# Patient Record
Sex: Male | Born: 1965 | Race: White | Hispanic: No | Marital: Married | Smoking: Former smoker
Health system: Southern US, Community
[De-identification: ages and names within clinical notes are randomized; demographics above are authoritative.]

## PROBLEM LIST (undated history)

## (undated) DIAGNOSIS — F319 Bipolar disorder, unspecified: Secondary | ICD-10-CM

## (undated) DIAGNOSIS — R55 Syncope and collapse: Secondary | ICD-10-CM

## (undated) DIAGNOSIS — E119 Type 2 diabetes mellitus without complications: Secondary | ICD-10-CM

## (undated) DIAGNOSIS — R569 Unspecified convulsions: Secondary | ICD-10-CM

## (undated) DIAGNOSIS — I10 Essential (primary) hypertension: Secondary | ICD-10-CM

## (undated) HISTORY — DX: Unspecified convulsions: R56.9

## (undated) HISTORY — DX: Essential (primary) hypertension: I10

## (undated) HISTORY — PX: OTHER SURGICAL HISTORY: SHX169

## (undated) HISTORY — DX: Syncope and collapse: R55

## (undated) HISTORY — DX: Bipolar disorder, unspecified: F31.9

---

## 1998-03-10 ENCOUNTER — Emergency Department (HOSPITAL_COMMUNITY): Admission: EM | Admit: 1998-03-10 | Discharge: 1998-03-10 | Payer: Self-pay | Admitting: Emergency Medicine

## 2000-02-16 ENCOUNTER — Encounter: Payer: Self-pay | Admitting: Emergency Medicine

## 2000-02-16 ENCOUNTER — Emergency Department (HOSPITAL_COMMUNITY): Admission: EM | Admit: 2000-02-16 | Discharge: 2000-02-16 | Payer: Self-pay | Admitting: Emergency Medicine

## 2000-08-23 ENCOUNTER — Emergency Department (HOSPITAL_COMMUNITY): Admission: EM | Admit: 2000-08-23 | Discharge: 2000-08-23 | Payer: Self-pay

## 2000-08-23 ENCOUNTER — Encounter: Payer: Self-pay | Admitting: Emergency Medicine

## 2000-09-29 ENCOUNTER — Encounter: Payer: Self-pay | Admitting: Emergency Medicine

## 2000-09-30 ENCOUNTER — Encounter: Payer: Self-pay | Admitting: Emergency Medicine

## 2000-09-30 ENCOUNTER — Inpatient Hospital Stay (HOSPITAL_COMMUNITY): Admission: EM | Admit: 2000-09-30 | Discharge: 2000-10-05 | Payer: Self-pay | Admitting: Emergency Medicine

## 2000-10-01 ENCOUNTER — Encounter: Payer: Self-pay | Admitting: Internal Medicine

## 2000-11-23 ENCOUNTER — Emergency Department (HOSPITAL_COMMUNITY): Admission: EM | Admit: 2000-11-23 | Discharge: 2000-11-23 | Payer: Self-pay | Admitting: Emergency Medicine

## 2001-09-28 ENCOUNTER — Inpatient Hospital Stay (HOSPITAL_COMMUNITY): Admission: EM | Admit: 2001-09-28 | Discharge: 2001-10-16 | Payer: Self-pay | Admitting: *Deleted

## 2001-10-06 ENCOUNTER — Encounter: Payer: Self-pay | Admitting: Psychiatry

## 2001-10-06 ENCOUNTER — Ambulatory Visit (HOSPITAL_COMMUNITY): Admission: RE | Admit: 2001-10-06 | Discharge: 2001-10-06 | Payer: Self-pay | Admitting: Psychiatry

## 2001-12-06 ENCOUNTER — Inpatient Hospital Stay (HOSPITAL_COMMUNITY): Admission: EM | Admit: 2001-12-06 | Discharge: 2001-12-09 | Payer: Self-pay | Admitting: Psychiatry

## 2002-01-16 ENCOUNTER — Inpatient Hospital Stay (HOSPITAL_COMMUNITY): Admission: EM | Admit: 2002-01-16 | Discharge: 2002-01-18 | Payer: Self-pay | Admitting: Emergency Medicine

## 2002-01-16 ENCOUNTER — Encounter: Payer: Self-pay | Admitting: Emergency Medicine

## 2002-03-13 ENCOUNTER — Encounter: Payer: Self-pay | Admitting: Urology

## 2002-03-13 ENCOUNTER — Encounter: Admission: RE | Admit: 2002-03-13 | Discharge: 2002-03-13 | Payer: Self-pay | Admitting: Urology

## 2002-05-06 ENCOUNTER — Inpatient Hospital Stay (HOSPITAL_COMMUNITY): Admission: EM | Admit: 2002-05-06 | Discharge: 2002-05-07 | Payer: Self-pay | Admitting: Psychiatry

## 2002-09-17 ENCOUNTER — Inpatient Hospital Stay (HOSPITAL_COMMUNITY): Admission: EM | Admit: 2002-09-17 | Discharge: 2002-09-20 | Payer: Self-pay | Admitting: Psychiatry

## 2002-10-15 ENCOUNTER — Emergency Department (HOSPITAL_COMMUNITY): Admission: EM | Admit: 2002-10-15 | Discharge: 2002-10-15 | Payer: Self-pay | Admitting: Emergency Medicine

## 2003-02-08 ENCOUNTER — Encounter: Payer: Self-pay | Admitting: Emergency Medicine

## 2003-02-08 ENCOUNTER — Emergency Department (HOSPITAL_COMMUNITY): Admission: EM | Admit: 2003-02-08 | Discharge: 2003-02-08 | Payer: Self-pay

## 2003-06-16 ENCOUNTER — Encounter: Payer: Self-pay | Admitting: Emergency Medicine

## 2003-06-17 ENCOUNTER — Inpatient Hospital Stay (HOSPITAL_COMMUNITY): Admission: EM | Admit: 2003-06-17 | Discharge: 2003-06-18 | Payer: Self-pay | Admitting: Emergency Medicine

## 2003-06-17 ENCOUNTER — Encounter: Payer: Self-pay | Admitting: Neurology

## 2003-06-18 ENCOUNTER — Encounter: Payer: Self-pay | Admitting: Neurology

## 2003-09-18 ENCOUNTER — Inpatient Hospital Stay (HOSPITAL_COMMUNITY): Admission: EM | Admit: 2003-09-18 | Discharge: 2003-09-21 | Payer: Self-pay | Admitting: Emergency Medicine

## 2005-03-09 ENCOUNTER — Emergency Department (HOSPITAL_COMMUNITY): Admission: EM | Admit: 2005-03-09 | Discharge: 2005-03-09 | Payer: Self-pay | Admitting: Emergency Medicine

## 2006-05-21 ENCOUNTER — Ambulatory Visit: Payer: Self-pay | Admitting: Gastroenterology

## 2007-05-15 ENCOUNTER — Observation Stay (HOSPITAL_COMMUNITY): Admission: EM | Admit: 2007-05-15 | Discharge: 2007-05-15 | Payer: Self-pay | Admitting: Emergency Medicine

## 2007-05-15 ENCOUNTER — Ambulatory Visit: Payer: Self-pay | Admitting: Cardiology

## 2007-06-10 ENCOUNTER — Emergency Department (HOSPITAL_COMMUNITY): Admission: EM | Admit: 2007-06-10 | Discharge: 2007-06-10 | Payer: Self-pay | Admitting: Emergency Medicine

## 2007-07-11 ENCOUNTER — Emergency Department (HOSPITAL_COMMUNITY): Admission: EM | Admit: 2007-07-11 | Discharge: 2007-07-11 | Payer: Self-pay | Admitting: Emergency Medicine

## 2007-07-23 ENCOUNTER — Emergency Department (HOSPITAL_COMMUNITY): Admission: EM | Admit: 2007-07-23 | Discharge: 2007-07-23 | Payer: Self-pay | Admitting: Emergency Medicine

## 2007-07-30 ENCOUNTER — Emergency Department (HOSPITAL_COMMUNITY): Admission: EM | Admit: 2007-07-30 | Discharge: 2007-07-31 | Payer: Self-pay | Admitting: Emergency Medicine

## 2007-09-05 ENCOUNTER — Emergency Department (HOSPITAL_COMMUNITY): Admission: EM | Admit: 2007-09-05 | Discharge: 2007-09-05 | Payer: Self-pay | Admitting: Emergency Medicine

## 2008-03-15 ENCOUNTER — Emergency Department (HOSPITAL_COMMUNITY): Admission: EM | Admit: 2008-03-15 | Discharge: 2008-03-15 | Payer: Self-pay | Admitting: Emergency Medicine

## 2008-03-25 ENCOUNTER — Other Ambulatory Visit: Payer: Self-pay | Admitting: Emergency Medicine

## 2008-03-25 ENCOUNTER — Emergency Department (HOSPITAL_COMMUNITY): Admission: EM | Admit: 2008-03-25 | Discharge: 2008-03-25 | Payer: Self-pay | Admitting: Emergency Medicine

## 2008-03-26 ENCOUNTER — Observation Stay (HOSPITAL_COMMUNITY): Admission: EM | Admit: 2008-03-26 | Discharge: 2008-03-26 | Payer: Self-pay | Admitting: *Deleted

## 2008-03-27 ENCOUNTER — Ambulatory Visit: Payer: Self-pay | Admitting: *Deleted
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
## 2011-02-24 NOTE — Discharge Summary (Signed)
[name], [name]                         ACCOUNT NO.:  [id number]   MEDICAL RECORD NO.:  [id number]                   PATIENT TYPE:  IPS   LOCATION:  0301                                 FACILITY:  [facility]   PHYSICIAN:  Geoffery Lyons, MD                     DATE OF BIRTH:  August 01, 1966   DATE OF ADMISSION:  05/06/2002  DATE OF DISCHARGE:  05/07/2002                                 DISCHARGE SUMMARY   CHIEF COMPLAINT AND PRESENT ILLNESS:  This was one of several admissions to  [facility] for this 45 year old male, who presented to the  ACT team for evaluation.  Been unable to contract with his wife for two  hours on the phone, became panicky, somewhat desperate with a lot of worries  and concerns.  He was taking his medication but admits that he had changed  some medications in the past 1-2 days, maybe taking more of the  benzodiazepines as he has done in the past.  He denied any suicidal ideation  at this time.   PAST PSYCHIATRIC HISTORY:  Sees Dr. Evelene Croon on a regular basis.  This was one  of multiple admissions.   ALCOHOL/DRUG HISTORY:  Past history of mixed substance abuse.  Had abused  benzodiazepines before.   PAST MEDICAL HISTORY:  Distant history of seizures and history of arterial  hypertension.   MEDICATIONS:  Depakote 2000 mg at bedtime, Valium 10 mg three times a day,  lithium 450 mg twice a day.   PHYSICAL EXAMINATION:  Performed and failed to show any acute findings.   MENTAL STATUS EXAM:  Large-built male, bright, seeming alert, cooperative.  Good eye contact.  Anxious to leave the hospital.  Admitted with his 65-month-  old son.  Speech is normal.  Mood calm, euthymic.  Affect broad.  Thought  process with no suicidal or homicidal ideation.  Not wanting to be in the  hospital, had fear that he was admitted.  He was just wanting to have  someone to talk to.  Cognition well-preserved.   ADMISSION DIAGNOSES:   AXIS I:  1. Bipolar disorder.  2.  Anxiety disorder not otherwise specified.   AXIS II:  No diagnosis.   AXIS III:  1. History of seizures.  2. Hypertension.   AXIS IV:  Moderate.   AXIS V:  Global Assessment of Functioning upon admission 35; highest Global  Assessment of Functioning in the last year 60.   LABORATORY DATA:  CBC was within normal limits.  Blood chemistries were  within normal limits.  Thyroid profile was within normal limits.   HOSPITAL COURSE:  As patient was stating that he was not suicidal, that he  had an ongoing follow-up with his psychiatrist, that he was taking his  medication and his wife was agreeable with the plan, we went ahead and  discharged to outpatient follow-up.  DISCHARGE DIAGNOSES:   AXIS I:  1. Bipolar disorder.  2. Anxiety disorder not otherwise specified.   AXIS II:  No diagnosis.   AXIS III:  1. Seizures by history.  2. Hypertension.   AXIS IV:  Moderate.   AXIS V:  Global Assessment of Functioning upon discharge 55-60.   DISCHARGE MEDICATIONS:  Same medications as prescribed by Dr. Evelene Croon.   FOLLOW UP:  Dr. Evelene Croon as soon as possible.                                               Geoffery Lyons, MD    IL/MEDQ  D:  06/04/2002  T:  06/06/2002  Job:  [id number]

## 2011-02-24 NOTE — Consult Note (Signed)
[place]  Patient:    [name], [name]                      MRN: [id number] Proc. Date: 10/04/00 Adm. Date:  [id number] Attending:  Miguel Aschoff CC:         Rosanne Sack, M.D.  Robert A. Thurston Hole, M.D.   Consultation Report  DATE OF BIRTH:  1965-10-24.  CHIEF COMPLAINT:  Numbness in the right leg.  I was asked by Dr. Thurston Hole to see this 45 year old right-handed Caucasian male who has had a history of chronic alcohol abuse.  The patient fell down 30 steps at 1 p.m. on Saturday on September 28, 2000.  The patient did not have pain before he fell.  After the fall he had pain in his right lower quadrant and right lower extremity, especially his foot.  X-rays in the emergency department were negative but the patient continued to complain of severe pain and was admitted for further evaluation.  The patient has complained of numbness in the lateral anterior aspects of his right thigh and also right buttocks.  The numbness has extended down to the upper margins of his knee and actually has extended the lateral aspect of his right leg to his foot.  The patient has had exquisite tenderness in the lateral aspect of his left foot, where he had had a deep bruise.  He also has had traumatic muscle injury to his right abdominal wall with hemorrhage.  The patient developed rhabdomyolysis with CPKs of 45,264 and developed creatinine that peaked at 5.2, and dropped to 2.1 on the October 03, 2000. He fortunately did not need dialysis.  The patient has had persistent numbness in the right leg.  In order to further understand the extent of damage, neurology was asked to see this patient.  Other evaluations that have taken place have included MRI of the lumbar spine without contrast that shows a left L4-5 protrusion that may encroach on the left L4 nerve root, and mild facet disease at L5-S1, asymmetric to the right. There is no sign of  nerve root compression on the right side to explain the patients right-sided symptoms.  The patient has had some improvement of the numbness while at the same time continuing to complain of pain in the left abdomen which he says is keeping him awake, unless he takes "sleeping pills".  The patient has been detoxified with Librium and this is in the process of being tapered.  He is continuing to have some twitching which I suspect is related to alcohol withdrawal.  This will improve as detoxification is complete.  PAST MEDICAL HISTORY:  Negative.  REVIEW OF SYSTEMS:  Remarkable for palpitations of his heart especially when he is anxious and also asthma.  The patient has not had prior musculoskeletal injuries except to his left shoulder which was operated on.  (See below).  He has not had any rash, he has not had intercurrent infections.  He has not complained of headache.  He has not had any neurologic symptoms other than some weakness in the right leg and the hip flexors and numbness.  PAST SURGICAL HISTORY:  Left shoulder repair by Drs. Eulah Pont and Thurston Hole. Tonsillectomy as a child.  CUrRENT MEDICATIONS:  1. Colace 100 mg twice a day.  2. Protonix 40 mg per day.  3. Multivitamins 1 per day.  4. Lopressor 25 mg twice a day.  5. Phenergan 25 mg as  needed for nausea.  6. MS Contin SR twice a day.  7. Librium 10 mg per day (dose is being tapered).  8. Robaxin 500 mg every 6 hours as needed.  9. Laxative of choice. 10. Ativan  ALLERGIES:  PENICILLIN and CODEINE.  FAMILY HISTORY:  Strong history of family cancer.  Mother is alive and well. Father is disabled with cervical spondylosis.  He has 1 sister.  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  He drinks a 1/5th of hard liquor in a 2 day period but has stated that he wishes to go into long-term detoxification to deal with his alcoholism.  He smokes 1 pack of cigarettes per week.  He also uses marijuana  occasionally.  PHYSICAL EXAMINATION:  GENERAL:  The patient is a well-developed obese gentleman who was asleep when I came in to see him.  It took him a awhile to become aware of his surroundings.  VITAL SIGNS:  Blood pressure 147/69, resting pulse 82, respirations 20, temperature 97.1.  HEENT:  No signs of infections.  NECK:  Supple neck.  Full range of motion.  No cranial or cervical bruits.  LUNGS:  Clear to auscultation.  HEART:  No murmurs.  Pulse is normal.  ABDOMEN:  Soft, protuberant.  The patient has right abdominal wall tenderness. Bowel sounds are active.  No hepatosplenomegaly.  EXTREMITIES:  The right ankle is bruised.  NEUROLOGIC:  Awake, alert and oriented x 3.  Cranial nerves examination: Round reactive pupils.  Normal fundi.  Full visual fields to double simultaneous stimuli.  Extraocular movements full and conjugate.  Okay in responses and equal bilaterally.  Visual acuity not tested.  Symmetric facial strength.  Midline tongue and uvula.  Air conduction greater than bone conduction bilaterally.  MOTOR EXAMINATION:  The patient showed normal strength in all muscle groups other than the right hip flexors which were 4/5.  Sensation showed decreased sensation over the right buttock sparing the perianal region, the right lateral femorocutaneous ilioinguinal and mesial anterior region.  There is sparing of the posterior region below the buttocks.  The patient also is numb in the perineal nerve distribution and along the underside of her foot. There is no other numbness elsewhere.  Cortical sensation is intact.  CEREBELLAR EXAMINATION:  No tremor or dystaxia.  Symmetric gait and station was not tested.  Deep tendon reflexes are symmetric and brisk.  The patient had bilateral flexor plantar responses.  IMPRESSION: 1. The patient has a series of cutaneous nerve problems including lateral     femoral cutaneous, ilioinguinal, and the cutaneous nerves off the  sciatic    involving his buttocks.  He also has involvement of the perineal nerve but    only sensory because he does not show foot drop.  He does show some    involvement of the right femoral nerve because of hip flexor weakness.  I    do not see any other weakness or reflex change.     It appears to me that the patient has bruised several superficial nerves,    or stretched them.  I do not believe that this is a plexopathy.  It clearly    is not a radiculopathy.     Nerve conductions and EMGs are not useful in terms of showing denervation    for 14-21 days.  In this case, October 14, 2000 to October 21, 2000.     The patient has already shown some improvement and I expect this to  spontaneously improve.  If the patient begins to develop dysesthesias which    are currently present over the dorsum and lateral aspect of the right foot    neurontin at a dose of 300 mg 3 times a day fully escalating to 600-900 mg    3 times a day would be useful for dysesthesias.  The amount in his system    could be checked with gabapentin.  We can perform a EMG nerve conduction with some utility between October 14, 2000, and October 21, 2000, and thereafter, however, if the patient is improving it is probably not worth it.  I appreciate the opportunity to see Mr. Markie.  If you have questions about this or I can be of assistance please do not hesitate to contact me. DD:  10/05/00 TD:  10/05/00 Job: [id number]
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
## 2011-02-24 NOTE — H&P (Signed)
NAMEELZA, [name]                         ACCOUNT NO.:  [id number]   MEDICAL RECORD NO.:  [id number]                   PATIENT TYPE:  INP   LOCATION:  3001                                 FACILITY:  [facility]   PHYSICIAN:  Casimiro Needle L. Thad Ranger, M.D.           DATE OF BIRTH:  1966-05-26   DATE OF ADMISSION:  06/16/2003  DATE OF DISCHARGE:                                HISTORY & PHYSICAL   CHIEF COMPLAINT:  Headache.   HISTORY OF PRESENT ILLNESS:  This is the initial [facility]  admission for this 45 year old male with a past medical history which  includes bipolar disorder. The patient reports experiencing the worst  headache of his life with very sudden onset at about 6 p.m. in the evening  of June 16, 2003. The patient's father witnessed the event and says that  the patient suddenly complained of severe headache as if he had been hit up  against the head. He then slumped against a car and seemed to have  difficulty moving his left side and to slur his speech as if he could not  move the left part of his mouth and tongue. These focal symptoms lasted 25  to 30 minutes and then resolved, and there was no associated loss of  consciousness. The headache persists, and he describes it as a severe  piercing pain in the left frontotemporal area with some mild photophobia and  nausea as well as some stiffness of the neck. The patient has had headaches  in the past which he says he would take pain medication, either over-the-  counter medicines or hydrocodone preparation, and they would resolve. He has  never had a headache this severe. When the episode occurred, his father  alerted EMS who found him to have an elevated blood pressure, and he was  brought here for further consideration.   PAST MEDICAL HISTORY:  Past medical history is remarkable for bipolar  disorder for which he sees Dr. Pervis Hocking. He has no known previous history of  hypertension. He is obese.   FAMILY  HISTORY:  Remarkable for other members with bipolar disorder.   SOCIAL HISTORY:  He presently lives with father who is his power of  attorney. His father denies any alcohol or illicit drug use.   ALLERGIES:  CODEINE. He is also intolerant to LITHIUM.   MEDICATIONS:  1. Depakote ER 2,500 mg q.h.s.  2. Seroquel 100 mg q.a.m., 100 mg q.p.m., 600 mg q.h.s.  3. Valium 10 mg t.i.d.  4. Lorcet 10/650 p.r.n.  5. Geodon, just started, uncertain dose.   REVIEW OF SYSTEMS:  Remarkable for the headache as above. He has also had  diarrhea for the last couple of days as well as subjective chills and  occasional cough and shortness of breath but no chest pain. His father says  that he was started Geodon a few days ago, and since then, he has  been a  little bit more hyper and expansive. Ten-system review of systems is  otherwise negative except as stated above.   PHYSICAL EXAMINATION:  VITAL SIGNS:  Temperature 97.4, blood pressure  152/95, pulse 88, respirations 20.  GENERAL:  Obese but relatively healthy appearing man in some distress due to  pain, lying supine in the hospital bed.  HEART:  Cranial nerves funduscopic exam is benign. Pupils are equal and  reactive. Extraocular movements are full. Face, tongue, and palate move  normally and symmetrically.  NECK:  Supple without carotid bruit. There is some mild stiffness but no  Kernig's or Brudzinski's sign.  HEART:  Regular rate and rhythm without murmurs.  CHEST:  Clear to auscultation.  ABDOMEN:  Obese, nontender, normal active bowel sounds.  EXTREMITIES:  Motor exam:  Normal bulk and tone. Normal strength but there  is some give way due to motor impersistence on the left. Sensation:  He  reports diminished light touch sensation over the left hand and foot  compared to the right. Reflexes are symmetric. Toes are downgoing.   LABORATORY DATA:  CBC is unremarkable. BMET is remarkable only for a mildly  elevated ALT of 44. Coags are normal.  Urine drug screen is positive for  opiates and benzodiazepines. Depakote level is less than 10. CT of the head  is personally reviewed, and I agree that it is normal. Spinal fluid  examination reveals 33 red blood cells in tube #2, 8 red blood cells in tube  #4 with normal white cells, normal glucose, and normal protein.   IMPRESSION:  Headache with focal features. Etiology is uncertain. He does  have a small amount of blood in his CSF, and while this is likely due to  traumatic tap, subarachnoid hemorrhage cannot be entirely excluded.   PLAN:  Will admit for observation. Proceed tomorrow with angiogram as well  as MRI of the brain. He will also get pain control, and we will try  intravenous Dilaudid for this purpose.                                                Michael L. Thad Ranger, M.D.    MLR/MEDQ  D:  06/17/2003  T:  06/17/2003  Job:  [id number]

## 2011-02-24 NOTE — Discharge Summary (Signed)
[name]  Patient:    [name], [name] Visit Number: [id number] MRN: [id number]          Service Type: MED Location: 2000 2008 01 Attending Physician:  Jetty Duhamel T Dictated by:   Jetty Duhamel, M.D. Admit Date:  01/16/2002 Disc. Date: 01/18/02                             Discharge Summary  DATE OF BIRTH:  May 30, 1966  PRIMARY CARE PHYSICIAN:  Unassigned.  OUTPATIENT PSYCHIATRIST:  Youlanda Mighty, M.D.  DISCHARGE DIAGNOSES: 1. Toxic ingestion of multiple substances - benzodiazepines, opiates,    tricyclics. 2. Suicide attempt. 3. History of bipolar disorder. 4. Hypertension. 5. Asthma. 6. History of alcohol abuse. 7. Hypokalemia - resolved. 8. Abdominal pain of unknown etiology - work-up negative.  DISCHARGE MEDICATIONS: 1. Toprol XL 100 mg q.d. 2. Albuterol inhaler 2 puffs q.i.d. p.r.n. wheezing. 3. Further psychotropic medications to be determined during inpatient    hospitalization at [facility]. 4. Pepcid 20 mg p.o. b.i.d.  CONSULTATIONS:  Dr. Rico Junker with inpatient psychiatric services for evaluation and psychiatric disposition.  PROCEDURES: 1. CT scan of the head without contrast medium on January 16, 2002, no acute    abnormality. 2. Complete abdominal ultrasound on January 17, 2002, wholly unremarkable. 3. KUB on January 18, 2002, normal.  FOLLOW-UP:  The patient is advised to follow up with his routine medical doctor within 10-14 days after his discharge from [facility] for ongoing medical attention.  HISTORY OF PRESENT ILLNESS:  Mr. [name] is a gentleman with a medical history as noted above, who presented to the hospital on date of admission after having consumed a very large but unknown quantity of primarily Valium with an estimation of approximately 97 pills ingested at once.  He was also noted on drug screen to be positive for tricyclics as well as opiates, but  the exact source of these could not be identified.  He was admitted to the hospital by the [facility] for medical clearance and psychiatric consultation for ultimate psychiatric disposition.  HOSPITAL COURSE: #1 - TOXIC INGESTION/SUICIDE ATTEMPT:  The patient was admitted to a monitored bed with 24-hour sitter for suicide precautions.  Given his benzodiazepine ingestion, he was monitored initially with continuous pulse oximetry and cardiac telemetry.  There was no significant respiratory depression, although the patient was markedly somnolent during initial portion of hospitalization. O2 saturation was well-maintained with only minimal supplemental oxygen initially and later parts of hospitalization, no oxygen whatsoever was required.  There was no evidence of aspiration during the hospitalization. Tricyclics were noted on urine drug screen, but there were no EKG abnormalities or metabolic derangements to suggest a significant ingestion of tricyclics.  The exact source of the tricyclics could not be identified.  No significant abnormalities were appreciated on telemetry monitoring.  From the standpoint of his toxic ingestion/suicide attempt, the patient was medically cleared on January 18, 2002, for ultimate psychiatric disposition.  #2 - BIPOLAR DISORDER:  The patient has a long history of bipolar disorder and episodes of violent/aggressive behavior which is difficult to control. Because of his complicated psychiatric history and his suicide attempt, Dr. Jeanie Sewer with inpatient psychiatric services was consulted for evaluation.  Dr. Jeanie Sewer left a complete consultation note on January 17, 2002, in which he stated that the patient was felt to be a risk for suicide and a  danger to himself.  Discussion was held with the patients father, who is his power of attorney, who apparently agreed to see that the patient was committed to [facility] for inpatient  psychiatric therapy.  Dr. Jeanie Sewer did recommend that should the patients father renege on his decision to have the patient committed, that he would proceed with involuntary commitment because the patient was deemed to be a danger to himself.  Psychotropic medications were held, and ultimate regimen will be determined with intensive psychiatric follow up via [facility] upon transfer from [facility]  #3 - ABDOMINAL PAIN:  On the second of hospitalization, when the patient awoke, he began to complain of vague abdominal pain.  Initially he stated that it was sharp in nature and mostly in the right upper quadrant but with repeat questioning, he stated that it was generalized and more crampy.  Despite the patients abdominal pain, he continued to have a voracious appetite and repeatedly demanded food.  Because of the patients abdominal pain, however, he was kept n.p.o. initially until a full evaluation could be obtained. Ultrasound of the abdomen was obtained without any significant findings to include a normal gallbladder and no evidence of hepatic parenchymal damage. Further evaluation was obtained with a KUB to assure that there was no evidence of ileus, and the KUB itself was in fact normal as well.  The patients pain initially was treated with morphine sulfate.  The patient reported some resolution of pain with this.  Of note, the pancreas was noted to be unremarkable on ultrasound as well.  There was no evidence of splenomegaly, ascites, or abdominal aortic aneurysm.  There was no evidence of renal mass or hydronephrosis.  With a completely normal ultrasound, completely normal KUB, and comprehensive metabolic panel including wholly unremarkable liver function tests, it was felt that the patient no longer required inpatient evaluation for his abdominal pain.  Potential etiologies for the patients ongoing pain include simple pulled muscle within the abdominal wall versus a  simple gastritis.  At time of medical clearance, the patient was able to tolerate a full diet without any difficulty whatsoever.  On physical exam,  there was absolutely no distention of the abdomen with positive bowel sounds and no hepatosplenomegaly, and the abdomen was soft.  In short, there were no significant findings on physical exam to suggest significant abdominal pathology.  It was recommended that over-the-counter analgesics such as ibuprofen or even preferably Tylenol be used to control the patients pain and consideration could be given to use of a proton pump inhibitor such as Protonix or simply Pepcid at 20 mg b.i.d. for one week.  #4 - HYPERTENSION:  At time of admission, the patient was known to have a history of hypertension and had been treated with Toprol XL in the outpatient setting.  This was initially held because of the patients overall sedated state.  When the patient became more awake, however, vital signs did begin to trend upward with blood pressure in the 150 range.  As a result, the patient is being restarted on his Toprol at time of his discharge.  #5 - HISTORY OF ASTHMA:  During hospitalization, the patient never exhibited any wheezing.  Asthma was under complete control.  At discharge, he is being advised to continue using albuterol on an as-needed basis via metered dose inhaler.  DISPOSITION:  The patient is now medically cleared for discharge from [facility].  As per results of the psychiatric consultation provided by Dr.  Williford, he will be transferred either voluntarily or involuntarily to [facility] for ongoing inpatient treatment for suicidal ideation. Dictated by:   Jetty Duhamel, M.D. Attending Physician:  Jetty Duhamel T DD:  01/18/02 TD:  01/18/02 Job: [id number]

## 2011-02-24 NOTE — Discharge Summary (Signed)
[name], [name]                         ACCOUNT NO.:  [id number]   MEDICAL RECORD NO.:  [id number]                   PATIENT TYPE:  INP   LOCATION:  3001                                 FACILITY:  [facility]   PHYSICIAN:  Genene Churn. Love, M.D.                 DATE OF BIRTH:  18-Nov-1965   DATE OF ADMISSION:  06/16/2003  DATE OF DISCHARGE:  06/18/2003                                 DISCHARGE SUMMARY   PATIENT ADDRESS:  [address], [place], [place]  [place] [address]   REASON FOR ADMISSION:  This is the first [facility] admission for  this 45 year old right-handed white male with a past history of bipolar  disorder and migraine headaches who experienced the worst headache of his  life at 6 p.m. on the evening of June 16, 2003 and was seen in the  emergency room.   HISTORY OF PRESENT ILLNESS:  This patient's father witnessed the event and  says that the patient suddenly complained of severe headache as if he had  been hit in the head with a baseball bat and then slumped to the floor with  complaints of difficulty moving his left side and slurred speech.  The focal  symptoms lasted 25-30 minutes and then resolved and was not associated with  definite loss of consciousness or seizure.  The headache persisted and is  described as a severe piercing pain in the left frontotemporal region and  subsequently in the hospital in the right frontotemporal region.  There is  mild photophobia and nausea with some stiffness of the neck.  The patient  has a past history of migraine headaches for which he has taken Stadol  prescribed by Dr. Kellie Shropshire.  He had never had a headache this severe and  the patient was seen by EMS who found elevated blood pressure and he was  brought to the [facility] for further evaluation.   PAST MEDICAL HISTORY:  His past medical history was significant for bipolar  disorder.   FAMILY HISTORY:  He had a  positive family history for bipolar disorder.   SOCIAL HISTORY:  He lives with his father who is his Power of [place].  He  denies any history of alcohol or drug abuse.   ALLERGIES:  He is intolerant to LITHIUM and may have an allergy to CODEINE.   MEDICATIONS AT TIME OF ADMISSION:  1. Depakote ER 2500 mg at bedtime.  2. Seroquel 100 mg q.a.m. and 100 mg q.p.m. and 600 mg at bedtime.  3. Valium 10 mg three times daily.  4. Lorcet 10/650 p.r.n.  5. Geodon which was recently started and discontinued by the patient.   REVIEW OF SYSTEMS:  Review of systems was remarkable for headache, there had  also been some diarrhea over the last few days, he had started on Geodon but  this had been  discontinued.   EXAMINATION AT TIME OF ADMISSION:  VITAL SIGNS:  Temperature of 97.4, blood  pressure of 152/95, heart rate was 88, respiratory rate was 20.  GENERAL:  He was an obese male who appeared distressed with pain lying  supine.  HEAD AND NECK:  There were no bruits, there was some mild stiffness to his  neck.  The tympanic membranes were clear.  LUNGS:  Clear to auscultation.  HEART:  Regular rate and rhythm without murmurs.  ABDOMEN:  Normoactive bowel sounds.  EXTREMITIES:  Had normal strength in the upper and lower extremities, deep  tendon reflexes were 2+,  plantar responses were downgoing.   LABORATORY DATA:  Laboratory data revealed a CBC which is unremarkable, BMET  was remarkable for slightly elevated ALT of 44, coagulopathies were normal,  urine drug screen was positive for opiates and benzodiazepines, valproic  acid level was less than 10, CT scan of the head was reviewed and was  normal, spinal fluid examination revealed 33 red blood cells in the second  tube, 8 red blood cells in tube #4 with normal white blood cell count,  normal glucose, and normal protein.   The patient underwent a cerebral arteriogram on June 18, 2003 which  showed no evidence of any angiographic  abnormalities.  There was no evidence  of any dissection and no evidence of any spasm was noted.  During his  hospital course the patient underwent  treatment with Dilaudid therapy for  his pain with fair relief.  He had an MRI of the brain ordered, the results  of which I do not have at this time.  The cerebral arteriogram showed no  significant abnormalities.   IMPRESSION:  1. Severe headache - code 784.0.  2. History of migraine headaches - code 346.10.  3. Bipolar disease - code 296.7.   PLAN:  Discharge the patient to his father's care on his home medications  which would include Stadol nasal spray at the patient's request one spray in  one nostril for headache - no relief another two hours later - no more than  two sprays in 24 hours, Seroquel 100 mg b.i.d. and 600 mg at bedtime,  Depakote ER normally given at 2500 mg at bedtime but he takes it 1000 mg in  the morning, 1000 mg at lunch and 500 mg at bedtime, and Valium 10 mg three  times daily.  He is not to return to work until he sees Dr. Thad Ranger in 1  week.  He is not to drive a car.  He is discharged improved from his  prehospital status.                                                Genene Churn. Sandria Manly, M.D.    JML/MEDQ  D:  06/18/2003  T:  06/19/2003  Job:  [id number]   cc:   Milagros Evener, M.D.  [address] [address]  [place], [place] [address]  Fax: [phone]   Merlene Laughter. Renae Gloss, M.D.  [address]  [address]  [place]  [place] [address]  Fax: [phone]

## 2011-02-24 NOTE — H&P (Signed)
[name], [name]                         ACCOUNT NO.:  [id number]   MEDICAL RECORD NO.:  [id number]                   PATIENT TYPE:  IPS   LOCATION:  0301                                 FACILITY:  [facility]   PHYSICIAN:  Geoffery Lyons, M.D.                   DATE OF BIRTH:  Jan 05, 1966   DATE OF ADMISSION:  05/06/2002  DATE OF DISCHARGE:  05/07/2002                         PSYCHIATRIC ADMISSION ASSESSMENT   IDENTIFYING INFORMATION:  This is a 45 year old, Caucasian male. He is a  voluntary admission. He is married.   HISTORY OF PRESENT ILLNESS:  This patient presented to the ________  yesterday for evaluation after being unable to contact his wife by phone for  about 2 hours, he became a bit panicky and a somewhat agitated and he felt a  bit desperate and had some vague suicidal thoughts related to the fact that  he was becoming a little bit depressed and he also admitted that he had not  taken his medications regularly in 1-2 days. He became less panicky after he  was able to contact his wife and she just reported she had simply turned the  phone down so that the baby would go to sleep. The patient was calm and  cooperative today, well-focused, denies any suicidal ideation. He does admit  that he has had a few additional worries lately because his wife is going  through some postpartum depression and he has had concerns about that. He  categorically guarantees safety and denies any suicidal ideation.   PAST PSYCHIATRIC HISTORY:  This is one of multiple admissions for this man  with a history of bipolar disorder, a distant history of seizure disorder  and some past history of substance abuse. The patient is followed by Dr.  Dossie Arbour.   SOCIAL HISTORY:  This is a 45 year old male who lives in a stable marriage  with his wife and 61-month-old son. He currently is on disability for a mood  disorder.   FAMILY HISTORY:  Unclear.   ALCOHOL AND DRUG HISTORY:  He denies any current  substance abuse.   MEDICAL PROBLEMS:  Patient does see his primary care physician regularly for  history of hypertension. He also has a distant history of seizures which is  unclear.   MEDICATIONS:  Depakote 200 mg p.o. q.h.s., Valium 10 mg p.o. t.i.d. and  lithium 450 mg p.o. b.i.d.   DRUG ALLERGIES:  PENICILLIN.   POSITIVE PHYSICAL FINDINGS:  Patient's vital signs on admission to the unit:  Temp 98.1, pulse 60, respirations 22, blood pressure 144/89. He is  approximately 6 feet tall and weighs 223 pounds. This is a well-nourished,  well-developed male with a calm affect, hygiene satisfactory.  HEAD: Normocephalic, atraumatic. Grooming is good. Some male pattern  baldness.  EENT: PERRLA. Sclerae and lenses appear clear. Fundi not visualized. Hearing  intact to normal voice. No rhinorrhea. Oropharynx is noninjected. Teeth in  satisfactory condition.  NECK: Supple. No thyromegaly. Full range of motion.  CARDIOVASCULAR: S1 and S2 heard. No clicks, murmurs or gallops.  LUNGS: Clear to auscultation.  CHEST: Symmetrical with good excursion. Breathing easy.  ABDOMEN: Flat, soft and quiet. Nontender.  GENITALIA: Deferred.  MUSCULOSKELETAL: Spine is straight. Posture upright. Strength is 5/5. No  tenderness or erythema of joints.  NEUROLOGIC: Cranial nerves II-XII intact. EOM's intact without nystagmus.  2+/5 DTR's, symmetrical. Romberg without findings. Motor movements smooth.  Sensory grossly intact.   MENTAL STATUS EXAM:  This is a large-built male who is calm and composed. He  is in control with a bright, calm affect. He is fully alert in no acute  distress. His eye contact is good. He is well-focused. He is quite bright  and anxious to share photos of his 55-month-old son. His speech is normal.  His mood is calm and euthymic. He feels a bit embarrassed about his level of  panic yesterday when he was unable to contact his wife. His thought is  logical, coherent, goal-directed. No  evidence of suicidal ideation or  homicidal ideation. No thought deficit. He categorically promises safety.  Cognitively, he is intact and oriented x 3.   IMPRESSION:  AXIS I:  1.  Adjustment Reaction Not Otherwise Specified.  1. Bipolar Disorder, currently stable.  AXIS II:  Deferred.  AXIS III:  1.  Distant history of seizure disorder.  1. History of hypertension.  AXIS IV:  Deferred.  AXIS V:  Current: 58, Past year: 54   PLAN:  To voluntarily admit the patient. We have for the past 24 hours. We  will discharge him today and refer him to [facility] for psychotherapy  and have him resume his current medications. He will resume appointments  with Dr. Lafayette Dragon for medication management. This is both discharge and  admission transcription.    ____________________________________                                            Viviann Spare, N.P.    MAS/MEDQ  D:  05/07/2002  T:  05/09/2002  Job:  [id number]

## 2011-02-24 NOTE — Consult Note (Signed)
[place]  Patient:    [name], [name]                      MRN: [id number] Adm. Date:  [id number] Attending:  Rosanne Sack CC:         Rosanne Sack, M.D.   Consultation Report  REASON FOR CONSULTATION:  Elevated creatinine.  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  He was found to have a swollen right abdominal oblique muscles on CT scan, but did not sustain any bone fractures.  He was found to have marked rhabdomyolysis with a CPK of 45,000 and renal insufficiency with a creatinine of 3.5 on admission this morning.  He has a history of alcohol abuse with heavy drinking after work at least a few times a week.  He denies any history of renal disease or over-the-counter NSAID use.  His only complaint currently is right abdominal wall pain.  No shortness of breath, nausea, vomiting, or chest pain.  PAST MEDICAL HISTORY:  None.  PAST SURGICAL HISTORY:  None offered.  MEDICATIONS:  None.  INPATIENT MEDICATIONS:  Morphine PCA, Librium, ______, and Colace.  ALLERGIES:  PENICILLIN, ______.  SOCIAL HISTORY:  The patient is separated and has a 59 year old daughter who lives with the patients wife in [place].  He works in [employer]. Alcohol use is heavy as above.  Tobacco about one pack per day.  Occasional marijuana.  REVIEW OF SYSTEMS:  GENERAL:  No fevers, chills, or weight loss.  ENT:  No hearing loss, visual changes, sore throats, difficulty swallowing. RESPIRATORY:  No productive cough, hemoptysis, or pleuritic chest pain. CARDIAC:  No history of MI, angina, or substernal chest pain currently. PHYSICAL EXAMINATION:  GENERAL:  This is an overweight white male sedated, but alert and oriented ______ and appropriate.  VITAL SIGNS:  Blood pressure 160/90, temperature 99, pulse 90, respirations 20.  SKIN:  No  rash or ______ reticularis pattern.  HEENT:  Negative, particularly throat is moist.  NECK:  Supple without JVD or meningismus.  CHEST:  Clear bilaterally.  CARDIAC:  Regular rate and rhythm without murmur, rub, gallop.  ABDOMEN:  Soft, obese with right-sided abdominal wall tenderness.  Bowel sounds are active.  No hepatosplenomegaly.  EXTREMITIES:  No significant edema.  NEUROLOGIC:  Alert, oriented x 3.  Nonfocal motor examination.  LABORATORIES:  Sodium 136, potassium 4.5, CO2 20, BUN 42, creatinine 5.1. Those laboratories are from 10:00 today.  Previous creatinine earlier this morning was 3.5, now increased to 5.1.  CPK has gone from 45 to 36,000 today. Urinalysis shows large blood and heavy protein.  CBG was not done.  SGOT 285, SGPT 152.  Chest x-ray I do not believe was done either.  IMPRESSION: 1. Acute renal failure due to rhabdomyolysis/ATN complicating a fall with    abdominal wall muscle injury.  Also, history of heavy alcohol use which    possibly could predispose him to muscle injury. 2. Status post fall yesterday. 3. Alcohol history. 4. Tobacco use. 5. Volume:  ______ now.  RECOMMENDATIONS:  Agree with current management including bicarbonate containing fluids to try to alkalinize the urine.  Will have a Foley placed and follows strict Is and Os.  With the rapid rising creatinine he may progress to dialysis within the next few days.  This was explained to the patient in detail.  Plan for renal ultrasound  in the morning.  Lasix 60 IV q.12. DD:  09/30/00 TD:  09/30/00 Job: [id number]
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
## 2011-02-24 NOTE — Discharge Summary (Signed)
[place]  Patient:    [name], [name]                      MRN: [id number] Adm. Date:  [id number] Disc. Date: [id number] Attending:  Miguel Aschoff CC:         Aura Dials, M.D., [facility]  Robert A. Thurston Hole, M.D.  Richard F. Caryn Section, M.D.  Deanna Artis. Sharene Skeans, M.D.   Discharge Summary  DATE OF BIRTH:  [birth year]  CONSULTATIONS: 1. Robert A. Thurston Hole, M.D. 2. Wilber Bihari. Caryn Section, M.D. 3. Deanna Artis. Sharene Skeans, M.D.  PROCEDURES:  None.  DISCHARGE DIAGNOSES:  1. Fall resulting in soft tissue injury.  2. Rhabdomyolysis secondary to muscle injury, alcoholism (peak creatine     phosphokinase 45,000, 1187 at discharge).  3. Acute renal failure secondary to #2 (admission creatinine 3.5, peak 5.2,     1.1 at discharge).  4. Right oblique muscle hematoma.  5. Right ankle sprain.  6. Superficial nerve injury right hip and right lower extremity without     evidence of radiculopathy.  7. Alcoholism with inebriated presentation and withdrawal delirium.  8. Chronic bronchospasm (chronic obstructive pulmonary disease versus     asthma; no pulmonary function tests).  9. Tobacco abuse. 10. Nonspecific hepatitis, resolving, likely secondary to alcoholism and     rhabdomyolysis (peak serum glutamic oxaloacetic transaminase 285,     45 at discharge, peak serum glutamate pyruvate transaminase 152,     75 at discharge).  DISCHARGE MEDICATIONS:  1. Albuterol inhaler two puffs q.4h. p.r.n. wheezing.  2. Ibuprofen 600 mg q.6h. p.r.n.  3. Tylox one to two q.4-6h. p.r.n. pain #30 with no refill.  4. Colace 100 mg p.o. b.i.d. p.r.n.  DISCHARGE FOLLOWUP:  1. Dr. Tracey Harries, who is not familiar with the patient, has agreed to see     Mr. Jolliff in followup for recheck of creatinine, liver enzymes, and     clinical symptoms until Mr. Therien can establish with [facility].  2. He is to follow up with Dr. Salvatore Marvel  on January 3 or January 7.  HISTORY OF PRESENT ILLNESS:  Patient is a 45 year old single man who lives independently, admitted to the hospital after falling down 30 steps in an inebriated state.  He does not recall the fall, but awakened with significant pain in the right lower abdomen and right lower extremity, finding it difficult to breathe secondary to lower right chest wall pain.  For details of presentation, please see handwritten H&P by Dr. Marinda Elk, admitting physician on unassigned call.  HOSPITAL COURSE:  #1 - FALL RESULTING IN SOFT TISSUE INJURY:  [name] underwent plain films of the chest and abdomen as well as abdominal CT, and ultimately MRI of the spine.  Findings included a hematoma of the right internal and external oblique muscles.  Skeletal structures of the chest and pelvis were not injured.  MRI revealed no evidence of acute fracture or paraspinal hematoma, but commented on degenerative disk disease at L4-5 and L5-S1 associated with small left foraminal disk protrusion at L4-5 on the left, the opposite side of his injury.  The pain was treated with morphine pump to prevent atelectasis from respiratory splinting.  With time, he was transitioned to oral morphine and was better able to move about.  Significant neurologic findings included numbness over the right lower quadrant, right anterolateral thigh, and right buttock, consistent with superficial injury of  the right lateral femoral cutaneous, right peroneal nerve, right femoral nerve (motor), right ilioinguinal and superficial nerves off the serrati.  The only motor neuron involvement was of hip flexors, 4/5 on the right with no visible impairment during gait.  Dr. Ellison Carwin consulted day prior to discharge and felt that [name] would continue to spontaneously improve.  If he were to develop dysesthesias in the healing process, Neurontin beginning at 300 t.i.d. may be slowly titrated up to 600  to 900 t.i.d. while following gabapentin levels.  If he fails to improve, EMG and nerve conduction studies could be performed, no earlier than October 14, 2000.  Dr. Gerald Leitz group will be available for an outpatient referral should this be required for poor improvement.  At discharge, Mr. Hanshaw had significantly improved.  He has obvious edema and erythematous blush over the right oblique muscles, with improving tenderness.  He is breathing deeply with no further splinting.  He is ambulatory.  #2 - RHABDOMYOLYSIS SECONDARY TO ACUTE RENAL FAILURE:  [name] was noted to have elevated CPK of levels consistent with rhabdomyolysis.  Aggressive hydration with bicarbonated fluids was initiated, and renal function as well as elevated CPK resolved rapidly.  Discharge values noted on above problem list.  Because of autodiuresis, he has required aggressive potassium replacement, although now that creatinine has normalized this should not be a further problem.  Would advise followup of potassium at the same time creatinine is evaluated in his followup visit.  #3 - CHRONIC ALCOHOLISM:  [name] went through withdrawal manifested by delirium, tachycardia, hypertension, and tremor.  He was treated with thiamine and Librium, and is stable having completed withdrawal symptoms at the time of discharge.  He is very interested in rehab, and feels that an inpatient environment would be best for him.  Case manager social workers visited Mr. Bona in the hospital and provided literature for available community programs.  Enrollment will require his own initiative.  #4 - NONSPECIFIC HEPATITIS/RESOLVING:  [name] has at least two causative insults, alcohol and rhabdomyolysis.  His enzymes have improved dramatically and should continue to normalize.   #5 - RIGHT ANKLE SPRAIN:  [name] was evaluated by orthopedics who recommended a cane walker.  There did not appear to be fracture but he  does have significant swelling and ecchymosis primarily of the dorsal and lateral aspects of the foot.  He should continue elevation and ice application, and follow up as recommended with Dr. Salvatore Marvel in 7-10 days.  DD:  10/05/00 TD:  10/05/00 Job: [id number]

## 2011-02-24 NOTE — Discharge Summary (Signed)
   NAMERAVIS, HERNE                         ACCOUNT NO.:  [id number]   MEDICAL RECORD NO.:  [id number]                   PATIENT TYPE:  IPS   LOCATION:  0502                                 FACILITY:  [facility]   PHYSICIAN:  Jeanice Lim, M.D.              DATE OF BIRTH:  [birth year]   DATE OF ADMISSION:  09/17/2002  DATE OF DISCHARGE:  09/20/2002                                 DISCHARGE SUMMARY   No dictation for this job.                                               Jeanice Lim, M.D.    JEM/MEDQ  D:  09/29/2002  T:  09/29/2002  Job:  [id number]

## 2011-02-24 NOTE — H&P (Signed)
[name], [name]                         ACCOUNT NO.:  [id number]   MEDICAL RECORD NO.:  [id number]                   PATIENT TYPE:  IPS   LOCATION:  0502                                 FACILITY:  [facility]   PHYSICIAN:  Geoffery Lyons, M.D.                   DATE OF BIRTH:  [birth year]   DATE OF ADMISSION:  09/17/2002  DATE OF DISCHARGE:                         PSYCHIATRIC ADMISSION ASSESSMENT   IDENTIFYING INFORMATION:  This is a 45 year old married white male  voluntarily admitted on September 17, 2002.   HISTORY OF PRESENT ILLNESS:  The patient presents with a history of suicidal  ideation.  Reports he has been having problems with his family.  He states  his mother tells him he is unable to see his sister.  He did not elaborate  as what this situation was about.  The patient states he just wants to end  it all.  He has been noncompliant with his medications on and off for the  past few days.  States he has been out of town.  He is experiencing positive  auditory hallucinations that he states are talking about death all the time.  He denies any stressors.  He states he has not slept for the past four days.  He has a decreased appetite.  States he lost about 20 pounds.  Denies any  current hallucinations.  Denies any suicidal ideation.  He reports some  positive homicidal ideation towards his mother.  He states he wishes she  were dead but he would not make any attempt to harm her.   PAST PSYCHIATRIC HISTORY:  Fourth visit to [facility].  He was here  in July of 2003.  He sees Dr. Evelene Croon as an outpatient.  Was at [facility] in  the past.  Has a history of overdosing on Seroquel and states he stopped  drinking alcohol about two years ago.   SOCIAL HISTORY:  This is a 45 year old married white male, married for two  years, has a 15-month-old son.  Lives with his wife and child.  He has been  out of work for three years for psychiatric illness.  He states he is trying  to  obtain disability.  Reports no legal problems.   FAMILY HISTORY:  Grandfather committed suicide.   ALCOHOL/DRUG HISTORY:  He smokes.  He denies any alcohol or substance abuse.   PRIMARY CARE PHYSICIAN:  None.   MEDICAL PROBLEMS:  Hypertension and GERD.   MEDICATIONS:  Toprol XL 100 mg q.d., Nexium 40 mg q.d., Depakote ER 500 mg  in the morning, Klonopin 2 mg t.i.d., lithium 450 mg t.i.d., albuterol  inhaler p.r.n.   ALLERGIES:  No known allergies.   REVIEW OF SYSTEMS:  The patient has a history of asthma and reflux,  occasional joint pain and arthritis and history of hypertension.  Other  review of systems are noncontributory.   PHYSICAL EXAMINATION:  VITAL SIGNS:  Temperature 99, heart rate 91,  respirations 20, blood pressure 139/84.  He is 214 pounds.  He is 6 feet  tall.  GENERAL APPEARANCE:  The patient is a 45 year old Caucasian male.  He is  well-nourished, well-developed and in no acute distress.  HEENT:  Head is normocephalic.  Hair is thick, dark and evenly distributed.  EOMs are intact.  There is no sinus tenderness.  No nasal discharge.  Tongue  protrudes midline without tremor.  Negative lymphadenopathy.  CHEST:  Clear to auscultation.  HEART:  Regular rate and rhythm.  ABDOMEN:  Soft, nontender.  No CVA tenderness.  MUSCULOSKELETAL:  Muscle strength and tone is equal bilaterally.  Spine is  straight.  SKIN:  Warm and dry with good capillary refill.  NEURO:  Cranial nerves are grossly intact.  The patient able to perform heel  to shin and normal alternating movements without any difficulty.   LABORATORY DATA:  Liver function is within normal limits.   MENTAL STATUS EXAM:  He is an alert, casually dressed, well-nourished male  with good eye contact.  Speech is clear.  Mood is depressed and anxious.  The patient constantly moving his right leg.  Affect is anxious and teary-  eyed.  Thought processes are coherent.  No evidence of psychosis.  No  auditory or visual  hallucinations.  No suicidal or homicidal ideation or  paranoid ideation.  Cognitive function is intact.  Memory is fair.  Judgment  and insight is fair.   DIAGNOSES:   AXIS I:  Bipolar disorder, depressed.   AXIS II:  Deferred.   AXIS III:  1. Hypertension.  2. Gastroesophageal reflux disease.  3. Asthma.   AXIS IV:  Other psychosocial problems.   AXIS V:  Current 30; this past year 70.   PLAN:  Voluntary admission for depression and suicidal ideation.  Contract  for safety.  Check every 15 minutes.  Will resume his medications.  Will  obtain other laboratory data.  Depakote and lithium level.  Add Seroquel for  irritability and anxiety.  The patient to attend groups and have a family  session and to follow with Dr. Evelene Croon and to be medication-compliant.   TENTATIVE LENGTH OF STAY:  Two to three days.     [name], N.P.                       Geoffery Lyons, M.D.    JO/MEDQ  D:  09/19/2002  T:  09/19/2002  Job:  [id number]

## 2011-02-24 NOTE — Consult Note (Signed)
[name], [name]                         ACCOUNT NO.:  [id number]   MEDICAL RECORD NO.:  [id number]                   PATIENT TYPE:  EMS   LOCATION:  ED                                   FACILITY:  [facility]   PHYSICIAN:  Hanley Hays. Dechurch, M.D.           DATE OF BIRTH:  07-Dec-1965   DATE OF CONSULTATION:  10/15/2002  DATE OF DISCHARGE:                                   CONSULTATION   HISTORY OF PRESENT ILLNESS:  The patient is a 45 year old male with bipolar  disorder with history of multiple inpatient stays at [facility], most recently about four weeks ago.  He is otherwise in good health.  This morning he took 300 mg of trazodone, about 1 g of Depakote, about three  to four 300 mg lithium tablets, and several Seroquel, though no more than  four 25 mg tablets, and then proceeded to call his father about his overdose  who summoned 911.  The patient was brought to the emergency room for further  evaluation where he was indeed lethargic upon arrival.  He received charcoal  and lavage.  While awaiting his lithium level he returned to his baseline  mental status.  He was able to take p.o.'s.  He was alert, oriented,  appropriate, and wanting to go home.  On further evaluation the patient  stated he took the extra medication seeking relief from strife related to  his social circumstances.  He denied any suicidal intent or ideation.  He  has had no recent attempts at suicide.  Apparently, there are multiple  issues in regards to his current marital relationship that has been  difficult for him to deal with.  He does admit to occasionally utilizing  cocaine, most recently last p.m.  He denies any alcohol or other illicit  substance abuse.   MEDICATIONS:  1. His medication regimen usually includes Seroquel 50 q.i.d.  2. Trazodone 300 q.h.s.  3. Lithium 300 mg two to three daily.  4. Toprol XL.  5. Depakote.  6. Nexium 40 daily.   ALLERGIES:  He has no known  allergies.   FAMILY HISTORY:  Remarkable for sister with bipolar disorder and father with  borderline bipolar.   SOCIAL HISTORY:  He is married.  This is his second wife.  He has a 39-month-  old son.  He has a 58 year old daughter from his previous marriage.  No  alcohol or tobacco use.  Otherwise, as noted per the review of systems.  He  lives with his wife in her mobile home.   REVIEW OF SYSTEMS:  Essentially unremarkable.   PAST MEDICAL HISTORY:  1. Bipolar disorder.  2. History of left rotator cuff repair.   LABORATORY DATA:  Depakote level is 120.  Lithium level is 0.74.  Laboratories otherwise normal.   PHYSICAL EXAMINATION:  GENERAL:  Alert, appropriate male.  No distress.  NEUROLOGIC:  No focal  neurologic signs.  No ataxia.  Gait is intact.  He is  able to get off the bed without any difficulty.  He is oriented and again  appropriate.  VITAL SIGNS:  Blood pressure 118/70, pulse 70s and regular, respirations  unlabored, O2 saturation on room air 99%.  NECK:  Supple.  No JVD, adenopathy.  LUNGS:  Clear to auscultation.  ABDOMEN:  Slightly obese, soft, nontender.  HEART:  Regular.  No murmur.  EXTREMITIES:  Without clubbing, cyanosis, edema.   ASSESSMENT/PLAN:  1. Drug overdose without suicidal ideation.  2. Bipolar disorder.  3. Substance abuse.   PLAN:  The patient is being discharged to home under the care of his father  who seems reliable and plans to present to the [facility] assessment area in a.m. for further evaluation.  He hemodynamically  and medically is stable.  There is no indication for hospital admission at  this time.  The patient is given a no harm contract to fill and he will be  discharged to home.  He is felt to be appropriate.  This is discussed with  the ACT team as well and no medications or other recommendations are  provided for the patient with the exception that he is felt that family  counseling may be of  benefit.                                               Hanley Hays Josefine Class, M.D.    FED/MEDQ  D:  10/15/2002  T:  10/15/2002  Job:  [id number]
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
## 2011-02-24 NOTE — Discharge Summary (Signed)
NAMEORLANDA, [name]                         ACCOUNT NO.:  [id number]   MEDICAL RECORD NO.:  [id number]                   PATIENT TYPE:  IPS   LOCATION:  0502                                 FACILITY[facility]  [facility]   PHYSICIAN:  Geoffery Lyons, M.D.                   DATE OF BIRTH:  October 22, 1965   DATE OF ADMISSION:  09/17/2002  DATE OF DISCHARGE:  09/20/2002                                 DISCHARGE SUMMARY   CHIEF COMPLAINT AND PRESENTING ILLNESS:  This was fourth admission  to [facility]  for this 45 year old married white male, voluntarily  admitted.  History of suicidal ideas, having problems with his family.  His  mother tells him he is unable to see his sister.  He did not elaborate on  what the situation was.  He wanted to end it all.  He has been noncompliant  with medications, went off for the past few days, experiencing positive  auditory hallucinations, talking about death all the time.  Denies any  stressors.  Not slept in 4 days, has decreased appetite, has lost 20 pounds,  no current hallucinations, no suicidal ideas.  Brief homicidal ideas toward  the mother when he was upset but denies any actual ideations.   PAST PSYCHIATRIC HISTORY:  Fourth time [facility], last time  July 2003, Dr. Barnett Abu on an outpatient basis.  Last time [facility].  History of overdosing on Seroquel.  Stopped drinking alcohol 2  years ago.   ALCOHOL AND DRUG HISTORY:  Denies alcohol or any other substance abuse.   PAST MEDICAL HISTORY:  Hypertension, gastroesophageal reflux disease.   MEDICATIONS:  Toprol XL 100 daily, Nexium 40 mg daily, Depakote ER 500 in  the morning, Klonopin 2 mg 3 times a day, lithium 450 3 times a day.   PHYSICAL EXAMINATION:  Performed, failed to show any acute findings.   MENTAL STATUS EXAM:  Reveals an alert, casually dressed, well-nourished  male, good eye contact.  Speech is clear.  Mood is depressed and anxious.  Constantly moving his right leg.  Affect is anxious and teary-eyed.  Thought  processes are coherent, no evidence of psychosis, no auditory or visual  hallucinations, no suicidal or homicidal ideas.  Cognition well preserved.   ADMISSION DIAGNOSES:   AXIS I:  1. Bipolar disorder, depressed.  2. Anxiety disorder not otherwise specified.   AXIS II:  No diagnosis.   AXIS III:  Hypertension, gastroesophageal reflux disease, asthma.   AXIS IV:  Moderate.   AXIS V:  Global assessment of function upon admission 30, highest global  assessment of function in past year 65.   LABORATORY DATA:  Blood chemistries were within normal limits.  Drug screen  positive for benzodiazapines, prescribed.   COURSE IN HOSPITAL:  He was admitted and started on intensive individual and  group psychotherapy.  He was placed  back on his medications, Depakote ER  1000 mg at night, lithium 300 twice a day, Seroquel 100 at bedtime, Toprol  XL 100 every day, Nexium 40 mg in the morning, albuterol inhaler.  Was  placed back on lithium 300 3 times a day and Klonopin 2 mg 3 times a day.  Was given trazodone for sleep.  Seroquel was increased to 100 3 times a day  and 200 at night.  Trazodone was increased to 200 at bedtime.  He started  looking back at the events for several days, agitation, anxiety, did not  sleep, ongoing conflict with his wife.  He felt that he did the best on the  Seroquel so we went ahead and tried to optimize treatment with the Seroquel.  December 13 he was in full contact with reality, mood euthymic, affect  bright, broad, had basically worked out things with his wife about their  conflict, felt like it was resolved.  He will continue his medications and  pursue outpatient followup and hopefully couples' counseling.   DISCHARGE DIAGNOSES:   AXIS I:  1. Bipolar disorder, depressed.  2. Anxiety disorder not otherwise specified.   AXIS II:  No diagnosis.   AXIS III:  Arterial  hypertension.   AXIS IV:  Moderate.   AXIS V:  Global assessment of function upon discharge 50-55.   DISCHARGE MEDICATIONS:  1. Toprol XL 100 daily.  2. Nexium 20 mg daily.  3. Depakote ER 500 1 twice a day and 2 at night.  4. Lithium carbonate 300 3 times a day.  5. Klonopin 2 mg 3 times a day.  6. Seroquel 200 4 times a day.  7. Trazodone 150 2 at bedtime.  8. Ventolin inhaler.  9. Zithromax, continue.   DISPOSITION:  Followup with Dr. Barnett Abu.                                                Geoffery Lyons, M.D.    IL/MEDQ  D:  10/22/2002  T:  10/23/2002  Job:  [id number]
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
## 2011-03-05 ENCOUNTER — Inpatient Hospital Stay (HOSPITAL_COMMUNITY): Payer: Medicaid Other

## 2011-03-05 ENCOUNTER — Emergency Department (HOSPITAL_COMMUNITY): Payer: Medicaid Other

## 2011-03-05 ENCOUNTER — Inpatient Hospital Stay (HOSPITAL_COMMUNITY)
Admission: EM | Admit: 2011-03-05 | Discharge: 2011-03-10 | DRG: 683 | Discharge status: Against Medical Advice | Payer: Medicaid Other | Source: Other Acute Inpatient Hospital | Attending: Internal Medicine | Admitting: Internal Medicine

## 2011-03-05 ENCOUNTER — Emergency Department (HOSPITAL_COMMUNITY)
Admission: EM | Admit: 2011-03-05 | Discharge: 2011-03-05 | Disposition: A | Discharge status: Home / Self Care | Payer: Medicaid Other | Source: Home / Self Care | Attending: Emergency Medicine | Admitting: Emergency Medicine

## 2011-03-05 DIAGNOSIS — G8929 Other chronic pain: Secondary | ICD-10-CM | POA: Diagnosis present

## 2011-03-05 DIAGNOSIS — E785 Hyperlipidemia, unspecified: Secondary | ICD-10-CM | POA: Diagnosis present

## 2011-03-05 DIAGNOSIS — I10 Essential (primary) hypertension: Secondary | ICD-10-CM | POA: Diagnosis present

## 2011-03-05 DIAGNOSIS — Z87891 Personal history of nicotine dependence: Secondary | ICD-10-CM

## 2011-03-05 DIAGNOSIS — F319 Bipolar disorder, unspecified: Secondary | ICD-10-CM | POA: Diagnosis present

## 2011-03-05 DIAGNOSIS — N170 Acute kidney failure with tubular necrosis: Principal | ICD-10-CM | POA: Diagnosis present

## 2011-03-05 DIAGNOSIS — Z8673 Personal history of transient ischemic attack (TIA), and cerebral infarction without residual deficits: Secondary | ICD-10-CM

## 2011-03-05 DIAGNOSIS — M549 Dorsalgia, unspecified: Secondary | ICD-10-CM | POA: Diagnosis present

## 2011-03-05 DIAGNOSIS — E871 Hypo-osmolality and hyponatremia: Secondary | ICD-10-CM | POA: Diagnosis present

## 2011-03-05 DIAGNOSIS — J45909 Unspecified asthma, uncomplicated: Secondary | ICD-10-CM | POA: Diagnosis present

## 2011-03-05 LAB — RAPID URINE DRUG SCREEN, HOSP PERFORMED
Amphetamines: NOT DETECTED
Barbiturates: NOT DETECTED
Benzodiazepines: POSITIVE — AB
Cocaine: NOT DETECTED
Opiates: NOT DETECTED
Tetrahydrocannabinol: NOT DETECTED

## 2011-03-05 LAB — CREATININE, URINE, RANDOM: Creatinine, Urine: 117.78 mg/dL

## 2011-03-05 LAB — ETHANOL: Alcohol, Ethyl (B): <11 mg/dL — ABNORMAL HIGH (ref 0–10)

## 2011-03-05 LAB — BASIC METABOLIC PANEL
BUN: 124 mg/dL — ABNORMAL HIGH (ref 6–23)
CO2: 21 mEq/L (ref 19–32)
Calcium: 8.6 mg/dL (ref 8.4–10.5)
Chloride: 72 mEq/L — ABNORMAL LOW (ref 96–112)
Creatinine, Ser: 16.52 mg/dL — ABNORMAL HIGH (ref 0.4–1.5)
GFR calc Af Amer: 4 mL/min — ABNORMAL LOW (ref >60)
GFR calc non Af Amer: 3 mL/min — ABNORMAL LOW (ref >60)
Glucose, Bld: 115 mg/dL — ABNORMAL HIGH (ref 70–99)
Potassium: 4.2 mEq/L (ref 3.5–5.1)
Sodium: 118 mEq/L — CL (ref 135–145)

## 2011-03-05 LAB — DIFFERENTIAL
Basophils Absolute: 0 10*3/uL (ref 0.0–0.1)
Basophils Relative: 0 % (ref 0–1)
Eosinophils Absolute: 0 10*3/uL (ref 0.0–0.7)
Eosinophils Relative: 1 % (ref 0–5)
Lymphocytes Relative: 21 % (ref 12–46)
Lymphs Abs: 1.6 10*3/uL (ref 0.7–4.0)
Monocytes Absolute: 0.7 10*3/uL (ref 0.1–1.0)
Monocytes Relative: 9 % (ref 3–12)
Neutro Abs: 5.1 10*3/uL (ref 1.7–7.7)
Neutrophils Relative %: 69 % (ref 43–77)

## 2011-03-05 LAB — CBC
HCT: 32.5 % — ABNORMAL LOW (ref 39.0–52.0)
Hemoglobin: 11.6 g/dL — ABNORMAL LOW (ref 13.0–17.0)
MCH: 29 pg (ref 26.0–34.0)
MCHC: 35.7 g/dL (ref 30.0–36.0)
MCV: 81.3 fL (ref 78.0–100.0)
Platelets: 222 10*3/uL (ref 150–400)
RBC: 4 MIL/uL — ABNORMAL LOW (ref 4.22–5.81)
RDW: 12.5 % (ref 11.5–15.5)
WBC: 7.4 10*3/uL (ref 4.0–10.5)

## 2011-03-05 LAB — URINALYSIS, ROUTINE W REFLEX MICROSCOPIC
Bilirubin Urine: NEGATIVE
Bilirubin Urine: NEGATIVE
Glucose, UA: NEGATIVE mg/dL
Glucose, UA: NEGATIVE mg/dL
Ketones, ur: NEGATIVE mg/dL
Ketones, ur: >80 mg/dL — AB
Leukocytes, UA: NEGATIVE
Leukocytes, UA: NEGATIVE
Nitrite: NEGATIVE
Nitrite: NEGATIVE
Protein, ur: 100 mg/dL — AB
Protein, ur: 100 mg/dL — AB
Specific Gravity, Urine: 1.025 (ref 1.005–1.030)
Specific Gravity, Urine: 1.013 (ref 1.005–1.030)
Urobilinogen, UA: 0.2 mg/dL (ref 0.0–1.0)
Urobilinogen, UA: 0.2 mg/dL (ref 0.0–1.0)
pH: 5 (ref 5.0–8.0)
pH: 5.5 (ref 5.0–8.0)

## 2011-03-05 LAB — URINE MICROSCOPIC-ADD ON

## 2011-03-05 LAB — BLOOD GAS, ARTERIAL
Acid-base deficit: 7.5 mmol/L — ABNORMAL HIGH (ref 0.0–2.0)
Bicarbonate: 18.5 mEq/L — ABNORMAL LOW (ref 20.0–24.0)
Drawn by: 31101
FIO2: 0.21 %
O2 Saturation: 93.6 %
Patient temperature: 98.6
TCO2: 19.9 mmol/L (ref 0–100)
pCO2 arterial: 44.4 mmHg (ref 35.0–45.0)
pH, Arterial: 7.244 — ABNORMAL LOW (ref 7.350–7.450)
pO2, Arterial: 76.2 mmHg — ABNORMAL LOW (ref 80.0–100.0)

## 2011-03-05 LAB — NA AND K (SODIUM & POTASSIUM), RAND UR
Potassium Urine: 10 mEq/L
Sodium, Ur: 36 mEq/L

## 2011-03-05 LAB — AMMONIA: Ammonia: 16 umol/L (ref 11–60)

## 2011-03-05 LAB — GLUCOSE, CAPILLARY: Glucose-Capillary: 98 mg/dL (ref 70–99)

## 2011-03-05 LAB — MRSA PCR SCREENING: MRSA by PCR: NEGATIVE

## 2011-03-06 ENCOUNTER — Inpatient Hospital Stay (HOSPITAL_COMMUNITY): Payer: Medicaid Other

## 2011-03-06 DIAGNOSIS — N170 Acute kidney failure with tubular necrosis: Secondary | ICD-10-CM

## 2011-03-06 DIAGNOSIS — R579 Shock, unspecified: Secondary | ICD-10-CM

## 2011-03-06 LAB — BASIC METABOLIC PANEL
BUN: 109 mg/dL — ABNORMAL HIGH (ref 6–23)
BUN: 111 mg/dL — ABNORMAL HIGH (ref 6–23)
BUN: 115 mg/dL — ABNORMAL HIGH (ref 6–23)
BUN: 109 mg/dL — ABNORMAL HIGH (ref 6–23)
BUN: 107 mg/dL — ABNORMAL HIGH (ref 6–23)
BUN: 102 mg/dL — ABNORMAL HIGH (ref 6–23)
BUN: 99 mg/dL — ABNORMAL HIGH (ref 6–23)
CO2: 21 mEq/L (ref 19–32)
CO2: 21 mEq/L (ref 19–32)
CO2: 22 mEq/L (ref 19–32)
CO2: 20 mEq/L (ref 19–32)
CO2: 22 mEq/L (ref 19–32)
CO2: 21 mEq/L (ref 19–32)
CO2: 20 mEq/L (ref 19–32)
Calcium: 7.5 mg/dL — ABNORMAL LOW (ref 8.4–10.5)
Calcium: 7.4 mg/dL — ABNORMAL LOW (ref 8.4–10.5)
Calcium: 7.5 mg/dL — ABNORMAL LOW (ref 8.4–10.5)
Calcium: 7.4 mg/dL — ABNORMAL LOW (ref 8.4–10.5)
Calcium: 7.4 mg/dL — ABNORMAL LOW (ref 8.4–10.5)
Calcium: 7 mg/dL — ABNORMAL LOW (ref 8.4–10.5)
Calcium: 7.5 mg/dL — ABNORMAL LOW (ref 8.4–10.5)
Chloride: 78 mEq/L — ABNORMAL LOW (ref 96–112)
Chloride: 83 mEq/L — ABNORMAL LOW (ref 96–112)
Chloride: 83 mEq/L — ABNORMAL LOW (ref 96–112)
Chloride: 84 mEq/L — ABNORMAL LOW (ref 96–112)
Chloride: 85 mEq/L — ABNORMAL LOW (ref 96–112)
Chloride: 89 mEq/L — ABNORMAL LOW (ref 96–112)
Chloride: 91 mEq/L — ABNORMAL LOW (ref 96–112)
Creatinine, Ser: 14.76 mg/dL — ABNORMAL HIGH (ref 0.4–1.5)
Creatinine, Ser: 13.52 mg/dL — ABNORMAL HIGH (ref 0.4–1.5)
Creatinine, Ser: 13.88 mg/dL — ABNORMAL HIGH (ref 0.4–1.5)
Creatinine, Ser: 13.58 mg/dL — ABNORMAL HIGH (ref 0.4–1.5)
Creatinine, Ser: 12.92 mg/dL — ABNORMAL HIGH (ref 0.4–1.5)
Creatinine, Ser: 12.15 mg/dL — ABNORMAL HIGH (ref 0.4–1.5)
Creatinine, Ser: 10.99 mg/dL — ABNORMAL HIGH (ref 0.4–1.5)
GFR calc Af Amer: 4 mL/min — ABNORMAL LOW (ref >60)
GFR calc Af Amer: 5 mL/min — ABNORMAL LOW (ref >60)
GFR calc Af Amer: 5 mL/min — ABNORMAL LOW (ref >60)
GFR calc Af Amer: 5 mL/min — ABNORMAL LOW (ref >60)
GFR calc Af Amer: 5 mL/min — ABNORMAL LOW (ref >60)
GFR calc Af Amer: 5 mL/min — ABNORMAL LOW (ref >60)
GFR calc Af Amer: 6 mL/min — ABNORMAL LOW (ref >60)
GFR calc non Af Amer: 4 mL/min — ABNORMAL LOW (ref >60)
GFR calc non Af Amer: 4 mL/min — ABNORMAL LOW (ref >60)
GFR calc non Af Amer: 4 mL/min — ABNORMAL LOW (ref >60)
GFR calc non Af Amer: 4 mL/min — ABNORMAL LOW (ref >60)
GFR calc non Af Amer: 4 mL/min — ABNORMAL LOW (ref >60)
GFR calc non Af Amer: 5 mL/min — ABNORMAL LOW (ref >60)
GFR calc non Af Amer: 5 mL/min — ABNORMAL LOW (ref >60)
Glucose, Bld: 181 mg/dL — ABNORMAL HIGH (ref 70–99)
Glucose, Bld: 96 mg/dL (ref 70–99)
Glucose, Bld: 97 mg/dL (ref 70–99)
Glucose, Bld: 148 mg/dL — ABNORMAL HIGH (ref 70–99)
Glucose, Bld: 141 mg/dL — ABNORMAL HIGH (ref 70–99)
Glucose, Bld: 115 mg/dL — ABNORMAL HIGH (ref 70–99)
Glucose, Bld: 233 mg/dL — ABNORMAL HIGH (ref 70–99)
Potassium: 3.5 mEq/L (ref 3.5–5.1)
Potassium: 3.8 mEq/L (ref 3.5–5.1)
Potassium: 3.8 mEq/L (ref 3.5–5.1)
Potassium: 3.8 mEq/L (ref 3.5–5.1)
Potassium: 4.2 mEq/L (ref 3.5–5.1)
Potassium: 4.1 mEq/L (ref 3.5–5.1)
Potassium: 4.5 mEq/L (ref 3.5–5.1)
Sodium: 122 mEq/L — ABNORMAL LOW (ref 135–145)
Sodium: 125 mEq/L — ABNORMAL LOW (ref 135–145)
Sodium: 125 mEq/L — ABNORMAL LOW (ref 135–145)
Sodium: 125 mEq/L — ABNORMAL LOW (ref 135–145)
Sodium: 126 mEq/L — ABNORMAL LOW (ref 135–145)
Sodium: 127 mEq/L — ABNORMAL LOW (ref 135–145)
Sodium: 130 mEq/L — ABNORMAL LOW (ref 135–145)

## 2011-03-06 LAB — URINALYSIS, MICROSCOPIC ONLY
Bilirubin Urine: NEGATIVE
Glucose, UA: NEGATIVE mg/dL
Ketones, ur: NEGATIVE mg/dL
Nitrite: NEGATIVE
Protein, ur: NEGATIVE mg/dL
Specific Gravity, Urine: 1.01 (ref 1.005–1.030)
Urobilinogen, UA: 0.2 mg/dL (ref 0.0–1.0)
pH: 5 (ref 5.0–8.0)

## 2011-03-06 LAB — GLUCOSE, CAPILLARY: Glucose-Capillary: 80 mg/dL (ref 70–99)

## 2011-03-06 LAB — CORTISOL: Cortisol, Plasma: 11.6 ug/dL

## 2011-03-06 LAB — TSH: TSH: 1.663 u[IU]/mL (ref 0.350–4.500)

## 2011-03-06 LAB — CHLORIDE, URINE, RANDOM: Chloride Urine: 24 mEq/L

## 2011-03-06 LAB — OSMOLALITY: Osmolality: 292 mOsm/kg (ref 275–300)

## 2011-03-06 LAB — LACTIC ACID, PLASMA: Lactic Acid, Venous: 0.6 mmol/L (ref 0.5–2.2)

## 2011-03-06 LAB — PROCALCITONIN: Procalcitonin: 0.8 ng/mL

## 2011-03-06 LAB — OSMOLALITY, URINE: Osmolality, Ur: 228 mOsm/kg — ABNORMAL LOW (ref 390–1090)

## 2011-03-07 LAB — URINE CULTURE
Colony Count: NO GROWTH
Colony Count: NO GROWTH
Colony Count: NO GROWTH
Culture  Setup Time: 201205290054
Culture  Setup Time: 201205280324
Culture  Setup Time: 201205271825
Culture: NO GROWTH
Culture: NO GROWTH
Culture: NO GROWTH

## 2011-03-07 LAB — BASIC METABOLIC PANEL
BUN: 81 mg/dL — ABNORMAL HIGH (ref 6–23)
BUN: 75 mg/dL — ABNORMAL HIGH (ref 6–23)
CO2: 23 mEq/L (ref 19–32)
CO2: 24 mEq/L (ref 19–32)
Calcium: 7.9 mg/dL — ABNORMAL LOW (ref 8.4–10.5)
Calcium: 8.1 mg/dL — ABNORMAL LOW (ref 8.4–10.5)
Chloride: 94 mEq/L — ABNORMAL LOW (ref 96–112)
Chloride: 94 mEq/L — ABNORMAL LOW (ref 96–112)
Creatinine, Ser: 7.95 mg/dL — ABNORMAL HIGH (ref 0.4–1.5)
Creatinine, Ser: 6.73 mg/dL — ABNORMAL HIGH (ref 0.4–1.5)
GFR calc Af Amer: 9 mL/min — ABNORMAL LOW (ref >60)
GFR calc Af Amer: 11 mL/min — ABNORMAL LOW (ref >60)
GFR calc non Af Amer: 7 mL/min — ABNORMAL LOW (ref >60)
GFR calc non Af Amer: 9 mL/min — ABNORMAL LOW (ref >60)
Glucose, Bld: 274 mg/dL — ABNORMAL HIGH (ref 70–99)
Glucose, Bld: 219 mg/dL — ABNORMAL HIGH (ref 70–99)
Potassium: 4 mEq/L (ref 3.5–5.1)
Potassium: 4 mEq/L (ref 3.5–5.1)
Sodium: 133 mEq/L — ABNORMAL LOW (ref 135–145)
Sodium: 133 mEq/L — ABNORMAL LOW (ref 135–145)

## 2011-03-07 LAB — RENAL FUNCTION PANEL
Albumin: 3 g/dL — ABNORMAL LOW (ref 3.5–5.2)
Albumin: 2.9 g/dL — ABNORMAL LOW (ref 3.5–5.2)
BUN: 90 mg/dL — ABNORMAL HIGH (ref 6–23)
BUN: 87 mg/dL — ABNORMAL HIGH (ref 6–23)
CO2: 21 mEq/L (ref 19–32)
CO2: 23 mEq/L (ref 19–32)
Calcium: 7.7 mg/dL — ABNORMAL LOW (ref 8.4–10.5)
Calcium: 7.9 mg/dL — ABNORMAL LOW (ref 8.4–10.5)
Chloride: 95 mEq/L — ABNORMAL LOW (ref 96–112)
Chloride: 96 mEq/L (ref 96–112)
Creatinine, Ser: 10.13 mg/dL — ABNORMAL HIGH (ref 0.4–1.5)
Creatinine, Ser: 9.14 mg/dL — ABNORMAL HIGH (ref 0.4–1.5)
GFR calc Af Amer: 7 mL/min — ABNORMAL LOW (ref >60)
GFR calc Af Amer: 8 mL/min — ABNORMAL LOW (ref >60)
GFR calc non Af Amer: 6 mL/min — ABNORMAL LOW (ref >60)
GFR calc non Af Amer: 6 mL/min — ABNORMAL LOW (ref >60)
Glucose, Bld: 221 mg/dL — ABNORMAL HIGH (ref 70–99)
Glucose, Bld: 219 mg/dL — ABNORMAL HIGH (ref 70–99)
Phosphorus: 8 mg/dL — ABNORMAL HIGH (ref 2.3–4.6)
Phosphorus: 5.9 mg/dL — ABNORMAL HIGH (ref 2.3–4.6)
Potassium: 4.3 mEq/L (ref 3.5–5.1)
Potassium: 4.1 mEq/L (ref 3.5–5.1)
Sodium: 133 mEq/L — ABNORMAL LOW (ref 135–145)
Sodium: 134 mEq/L — ABNORMAL LOW (ref 135–145)

## 2011-03-07 LAB — GLUCOSE, CAPILLARY
Glucose-Capillary: 257 mg/dL — ABNORMAL HIGH (ref 70–99)
Glucose-Capillary: 227 mg/dL — ABNORMAL HIGH (ref 70–99)
Glucose-Capillary: 209 mg/dL — ABNORMAL HIGH (ref 70–99)
Glucose-Capillary: 278 mg/dL — ABNORMAL HIGH (ref 70–99)

## 2011-03-07 NOTE — H&P (Signed)
[name], [name]               ACCOUNT NO.:  [id number]  MEDICAL RECORD NO.:  [id number]           PATIENT TYPE:  I  LOCATION:  2103                         FACILITY:  [facility]  PHYSICIAN:  Hilary Hertz, MD      DATE OF BIRTH:  1966-09-19  DATE OF ADMISSION:  03/05/2011 DATE OF DISCHARGE:                             HISTORY & PHYSICAL   PRIMARY CARE PHYSICIAN:  Unassigned.  PRIMARY PSYCHIATRIC PHYSICIAN:  Archer Asa, M.D.  CHIEF COMPLAINT:  Altered mental status.  HISTORY OF PRESENT ILLNESS:  The patient is a 45 year old man with a history of questionable bipolar disorder, chronic pain, anxiety, and hypertension who presented with a 4-day history of altered mental status.  Most the history is obtained per the patient's wife due to the patient's altered mental status.  She states around Wednesday of this week the patient was "incoherent, mumbling, had slurred speech" and was not taking any of his medications and also was not really sleeping.  The following day, the patient was a little bit better, but then he started hallucinating, talking on the phone to people that were not there and mentioning things about family members who had been dead and the symptoms progressed over the week and she was able to get him to take some of his medications.  Initially the wife thought that this was a psychiatric episode, this was bipolar disorder, but then when he was not getting better after a few days, she brought him to the emergency room. He was found to be in acute renal failure at an outside hospital and transferred here for further care.  At this time, the patient denies any complaints.  Also per the wife, the patient has not eaten or drank much in the past 4 days.  At this time, the patient's family members have brought in [employer] and he is eating a Big Mac and drinking soda and he feels well.  He does have a low back pain which is a chronic symptom for him.  Otherwise, he has  no complaints.  REVIEW OF SYSTEMS:  Review of 10-organ systems was done and is negative except as stated above in the HPI.  ALLERGIES:  Questionable history of PENICILLIN allergy.  MEDICATIONS:  Per the patient's home medication list, 1. Trazodone 100 mg at bedtime. 2. Nexium 40 mg a day. 3. Valacyclovir 500 mg twice a day. 4. Amlodipine/benazepril 5/20 mg daily. 5. Klor-Con 10 mg daily. 6. Alprazolam 2 mg four times a day. 7. Tekturna 300 mg daily. 8. Crestor 40 mg at bedtime. 9. Endocet 10/325 every 6 hours up to 5 times a day. 10.Symbyax 12/25 1-2 times daily. 11.Ventolin HFA 200 metered dose inhaler.  PAST MEDICAL HISTORY:  GERD, anxiety, questionable history of bipolar disorder, benzodiazepine dependence, hypertension, and chronic back pain.  PAST SURGICAL HISTORY:  Rotator cuff surgeries.  SOCIAL HISTORY:  The patient is on disability, was a prior 56 pack-year smoker, quit 4 years ago.  Married, lives with his wife.  FAMILY HISTORY:  The patient's father had CAD.  Mother had cancer.  PHYSICAL EXAMINATION:  VITAL SIGNS:  On presentation were  103/39, 71, 12, 97.6.  On exam, the patient's systolic blood pressure was 126. GENERAL:  The patient is in no acute distress. HEENT:  Sclerae anicteric. CV:  Regular rate and rhythm.  No murmurs, rubs, or gallops. LUNGS:  Clear to auscultation bilaterally. ABDOMEN:  Obese, soft, nontender, nondistended. EXTREMITIES:  Trace lower extremity edema. NEUROLOGIC:  Strength is 5/5 in all extremities.  Sensation intact. Cranial nerves II through XII are grossly intact.  The patient is alert and oriented to name, place and almost completely oriented to time.  He was able to say that it was May 2012, but did not know the exact day. There is some slow mentation, but the speech is normal.  LABORATORY DATA:  At the outside hospital, the patient underwent a CT scan at 5:00 p.m. showing no acute abnormality.  His lab work shows a white count  of 7.4, hemoglobin 11.6, platelets 222.  Alcohol level negative.  Ammonia level is 16.  Sodium 118, potassium 4.2, chloride 72, bicarb 21, glucose 115, BUN 124, creatinine 16.5 with a GFR of 3. Calcium 8.6.  Urinalysis showed negative nitrite, negative leukocyte esterase, positive protein, moderate blood, wbc's 11-20, rbc is 7-10. Drug screen positive for benzodiazepines.  ASSESSMENT AND PLAN:  The patient is a 45 year old man with a history of bipolar disorder, anxiety, chronic benzodiazepine use, hypertension, and chronic back pain who presents with altered mental status. 1. Acute renal failure.  The etiology of this is unclear at this     point.  It could be obstruction versus a component of superimposed     dehydration.  The patient underwent a bladder scan after urination     of 100 mL today and he has 811 mL still in the bladder.  We will     place a [employer] catheter and measure strict I's and O's.  We will     order a bilateral renal ultrasound, check urine electrolytes, and     check a stat chest x-ray.  At this time Dr. Arlean Hopping from Nephrology     has been consulted and he will see the patient.  He does not appear     to have any acute indications for urgent dialysis at this time.  Renally dose all meds. 2. Hyponatremia.  This is likely due to the patient's renal failure     and probable concomitant dehydration.  We will check urine     electrolytes as above.  Check TSH, cortisol, urine and serum osmolality.  We will start normal saline 100 mL/hour per Dr. Malena Catholic recommendation and check every 4-hour sodium to     ensure that the patient is not correcting more than 10 in 24 hours     and more than 18 in 48 hours.  We will monitor closely.  3. Altered mental status.  This is likely due to the patient's     electrolyte derangements, but there could also be a psychiatric     component to this.  We will give the patient IV fluids as above and     discuss this case extensively with  Psychiatry.  At this time they     have no further recommendations.  Dr. Donell Beers, the patient's     attending psychiatrist is aware of his admission.  At this time     they would prefer to correct the patient's medical issues and if     his mental status change persist, then we dan officially consult  them.  Head CT was negative at outside hospital.  We will continue     to monitor the patient. 4. Hypertension and hyperlipidemia.  We will continue the patient's     home medications.  The patient's Danie Chandler does not need to be     renally dosed.  However, the patient will not be able to get his     home Crestor due to his renal failure. 5. Bipolar.  With the bipolar there was a question that is this     diagnosis is actually what the patient's has.  We will continue him     on his medications at this time including trazodone, alprazolam,     and Symbyax.  The patient was on Symbyax 1-2 times daily.  At this     time, we will just do once daily and if he needs to be on more     medications, we will increase that to twice daily. 6. Chronic back pain.  We will put the patient on Vicodin for pain. 7. Fluids, electrolytes, nutrition.  Fluids as above.  Replete     electrolytes as needed.  Put the patient on heart-healthy diet. 8. Prophylaxis.  Heparin for DVT prophylaxis.  DISPOSITION:  The patient is a full code.  His point of contact is his wife Pam at [phone].          ______________________________ Hilary Hertz, MD     JF/MEDQ  D:  03/05/2011  T:  03/05/2011  Job:  [id number]  Electronically Signed by Hilary Hertz MD on 03/07/2011 06:08:26 PM

## 2011-03-08 LAB — CBC
HCT: 31.7 % — ABNORMAL LOW (ref 39.0–52.0)
Hemoglobin: 11.1 g/dL — ABNORMAL LOW (ref 13.0–17.0)
MCH: 29.3 pg (ref 26.0–34.0)
MCHC: 35 g/dL (ref 30.0–36.0)
MCV: 83.6 fL (ref 78.0–100.0)
Platelets: 252 10*3/uL (ref 150–400)
RBC: 3.79 MIL/uL — ABNORMAL LOW (ref 4.22–5.81)
RDW: 13 % (ref 11.5–15.5)
WBC: 8.4 10*3/uL (ref 4.0–10.5)

## 2011-03-08 LAB — BASIC METABOLIC PANEL
BUN: 61 mg/dL — ABNORMAL HIGH (ref 6–23)
BUN: 51 mg/dL — ABNORMAL HIGH (ref 6–23)
CO2: 24 mEq/L (ref 19–32)
CO2: 26 mEq/L (ref 19–32)
Calcium: 8.1 mg/dL — ABNORMAL LOW (ref 8.4–10.5)
Calcium: 8 mg/dL — ABNORMAL LOW (ref 8.4–10.5)
Chloride: 99 mEq/L (ref 96–112)
Chloride: 101 mEq/L (ref 96–112)
Creatinine, Ser: 4.81 mg/dL — ABNORMAL HIGH (ref 0.4–1.5)
Creatinine, Ser: 3.41 mg/dL — ABNORMAL HIGH (ref 0.4–1.5)
GFR calc Af Amer: 16 mL/min — ABNORMAL LOW (ref >60)
GFR calc Af Amer: 24 mL/min — ABNORMAL LOW (ref >60)
GFR calc non Af Amer: 13 mL/min — ABNORMAL LOW (ref >60)
GFR calc non Af Amer: 20 mL/min — ABNORMAL LOW (ref >60)
Glucose, Bld: 227 mg/dL — ABNORMAL HIGH (ref 70–99)
Glucose, Bld: 229 mg/dL — ABNORMAL HIGH (ref 70–99)
Potassium: 3.9 mEq/L (ref 3.5–5.1)
Potassium: 3.6 mEq/L (ref 3.5–5.1)
Sodium: 135 mEq/L (ref 135–145)
Sodium: 137 mEq/L (ref 135–145)

## 2011-03-08 LAB — GLUCOSE, CAPILLARY
Glucose-Capillary: 161 mg/dL — ABNORMAL HIGH (ref 70–99)
Glucose-Capillary: 223 mg/dL — ABNORMAL HIGH (ref 70–99)
Glucose-Capillary: 252 mg/dL — ABNORMAL HIGH (ref 70–99)
Glucose-Capillary: 324 mg/dL — ABNORMAL HIGH (ref 70–99)

## 2011-03-09 DIAGNOSIS — A419 Sepsis, unspecified organism: Secondary | ICD-10-CM

## 2011-03-09 DIAGNOSIS — J9600 Acute respiratory failure, unspecified whether with hypoxia or hypercapnia: Secondary | ICD-10-CM

## 2011-03-09 LAB — RENAL FUNCTION PANEL
Albumin: 2.7 g/dL — ABNORMAL LOW (ref 3.5–5.2)
BUN: 41 mg/dL — ABNORMAL HIGH (ref 6–23)
CO2: 27 mEq/L (ref 19–32)
Calcium: 7.7 mg/dL — ABNORMAL LOW (ref 8.4–10.5)
Chloride: 108 mEq/L (ref 96–112)
Creatinine, Ser: 2.56 mg/dL — ABNORMAL HIGH (ref 0.4–1.5)
GFR calc Af Amer: 33 mL/min — ABNORMAL LOW (ref >60)
GFR calc non Af Amer: 27 mL/min — ABNORMAL LOW (ref >60)
Glucose, Bld: 122 mg/dL — ABNORMAL HIGH (ref 70–99)
Phosphorus: 3.1 mg/dL (ref 2.3–4.6)
Potassium: 3.5 mEq/L (ref 3.5–5.1)
Sodium: 143 mEq/L (ref 135–145)

## 2011-03-09 LAB — GLUCOSE, CAPILLARY
Glucose-Capillary: 114 mg/dL — ABNORMAL HIGH (ref 70–99)
Glucose-Capillary: 197 mg/dL — ABNORMAL HIGH (ref 70–99)
Glucose-Capillary: 221 mg/dL — ABNORMAL HIGH (ref 70–99)
Glucose-Capillary: 152 mg/dL — ABNORMAL HIGH (ref 70–99)

## 2011-03-10 LAB — BASIC METABOLIC PANEL
BUN: 27 mg/dL — ABNORMAL HIGH (ref 6–23)
CO2: 26 mEq/L (ref 19–32)
Calcium: 7.9 mg/dL — ABNORMAL LOW (ref 8.4–10.5)
Chloride: 107 mEq/L (ref 96–112)
Creatinine, Ser: 1.67 mg/dL — ABNORMAL HIGH (ref 0.4–1.5)
GFR calc Af Amer: 54 mL/min — ABNORMAL LOW (ref >60)
GFR calc non Af Amer: 45 mL/min — ABNORMAL LOW (ref >60)
Glucose, Bld: 156 mg/dL — ABNORMAL HIGH (ref 70–99)
Potassium: 3.2 mEq/L — ABNORMAL LOW (ref 3.5–5.1)
Sodium: 143 mEq/L (ref 135–145)

## 2011-03-10 LAB — GLUCOSE, CAPILLARY: Glucose-Capillary: 155 mg/dL — ABNORMAL HIGH (ref 70–99)

## 2011-03-10 NOTE — Discharge Summary (Signed)
NAMEDAVYON, FISCH NO.:  [id number]  MEDICAL RECORD NO.:  [id number]           PATIENT TYPE:  I  LOCATION:  5506                         FACILITY:  [facility]  PHYSICIAN:  Talmage Nap, MD  DATE OF BIRTH:  Jun 12, 1966  DATE OF ADMISSION:  03/05/2011 DATE OF DISCHARGE:  03/10/2011                        DISCHARGE SUMMARY(AMA)   PRIMARY ATTENDING ON THE CASE:  Lonia Blood, MD  CONSULTANT:  Nephrology, Dr. Delano Metz.  DISCHARGE DIAGNOSES: 1. Acute renal failure, questionable secondary to acute tubular     necrosis. 2. Bipolar affective disorder. 3. Hypertension. 4. Asthma. 5. History of transient ischemic attack. 6. History of chronic back pain.  HISTORY OF PRESENT ILLNESS:  The patient is a 45 year old Caucasian male with history of bipolar affective disorder, chronic pain and anxiety was admitted to the hospital on Mar 05, 2011, by Dr. Hilary Hertz with complaint of alteration in mentation and most of the history was said to have been obtained from the patient's spouse.  The patient was said to have  recurrent mumbling and had slurred speech and was said not to be taking his medications.  He was also said to be very, very sleepy.  The patient was also observed to be hallucinating and talking to people over the phone.  At the time the patient was seen by the admitting physician, he was said to have denied any complaint.  He was, however, admitted for further evaluation and stabilization.  PREADMISSION MEDICATIONS: 1. Trazodone 100 mg p.o. at bedtime. 2. Nexium 40 mg p.o. daily. 3. Valacyclovir 500 mg p.o. b.i.d. 4. Amlodipine/benazepril 5/20 one p.o. daily. 5. Potassium chloride 10 mEq one p.o. daily. 6. Alprazolam 2 mg four times a daily. 7. Tekturna 300 mg p.o. daily. 8. Crestor 40 mg at bedtime. 9. Endocet 10/325 one p.o. q. 6 p.r.n. up to five times a day. 10.Symbyax 12/25 1-2 times p.o. daily. 11.Ventolin HFA 2 metered dose  inhaler.  ALLERGIES:  To questionable PENICILLIN.  PAST SURGICAL HISTORY:  Rotator cuff repair.  SOCIAL HISTORY:  The patient said to be on disability, had a history of 25 pack year smoker, quit about 4 years ago, married and lives with his spouse.  FAMILY HISTORY:  York Spaniel to be positive for coronary artery disease.  REVIEW OF SYSTEMS:  Essentially documented in initial history and physical.  PHYSICAL EXAMINATION:  VITAL SIGNS:  Blood pressure was 102/39, pulse 71, respiratory rate 12, temperature 97.6. GENERAL:  He was said not to be in any distress. HEENT:  Pupils were reactive to light and extraocular muscles were intact. NECK:  No jugular venous distention.  No carotid bruits.  No lymphadenopathy. CHEST:  Clear to auscultation. CV:  Heart sounds are 1 and 2. ABDOMEN:  Soft, obese, nontender.  Liver, spleen, kidney not palpable. Bowel sounds are positive. EXTREMITIES:  Trace edema. NEUROLOGIC:  Muscle power 5/5 upper and lower extremity respectively. Sensory sensation was said to be intact and cranial nerves II throughXII was also intact.  His mentation was said to be slow and speech was normal.  LABORATORY DATA:  Initial complete blood count with differential showed WBC done on  Mar 05, 2011, showed WBC of 7.4, hemoglobin 11.6, hematocrit 32.5, MCV of 81.3 with a platelet count of 222.  Normal differentials. Alcohol level less than 11.  Ammonia level was 16.  Basic metabolic panel showed sodium of 118, potassium of 4.2, chloride of 72, bicarb is 21, glucose is 115, BUN is 124, creatinine 16.52.  Urinalysis unremarkable.  Urine microscopy showed a wbc 11-20 with many bacteria and urine drug screen was positive for benzodiazepine.  Routine MRSA screening negative.  Arterial blood gas showed pH of 7.244, pCO2 of 44.4, pO2 76.2 with a bicarb of 18.5.  A repeat urinalysis showed negative leukocyte esterase and nitrite but ketones greater than 80 with moderate blood.  A repeat  urine microscopy showed wbc 0-2, rare bacteria.  Urine sodium 36, urine potassium 10.  A repeat basic metabolic panel showed sodium of 122, potassium of 3.5, chloride of 70 with a  bicarb of 21, glucose is 181, BUN is 10, creatinine is 14.76. Urine osmolality 228.  Serum osmolality 292 normal.  Serum cortisol level 11.6.  TSH 1.65 normal.  Lactic acid level 0.6, procalcitonin level 0.80.  A repeat basic metabolic panel done on Feb 26, 2011, showed sodium of 127, potassium of 4.1, chloride of 90, bicarb of 21, glucose is 115, BUN is 102, creatinine is 12.15.  Urinalysis showed trace leukocyte esterase with negative nitrite.  Blood culture no growth. Urine culture x2 no growth.  A repeat renal function panel done on Mar 09, 2011, showed sodium of 143, potassium of 3.5, chloride of 108 with a bicarb of 27, glucose is 122, BUN is 41, creatinine is 2.56 and a basic metabolic panel done on March 10, 2011, showed sodium of 143, potassium of 3.2, chloride of 107, bicarb is 26, glucose is 156, BUN is 27, creatinine is 1.67.  IMAGING STUDIES:  Include CT of the head without contrast unremarkable. Chest x-ray showed mild vascular congestion noted.  However, lung was said to be clear.  Renal ultrasound showed normal bilateral renal ultrasound and a repeat chest x-ray done on Mar 06, 2011, showed enlargement of cardiac silhouette, no pneumothorax following right jugular line placement.  HOSPITAL COURSE:  The patient was admitted to ICU with an impression of acute renal failure and altered mental status was started on normal saline IV to go at rate of 400 mL an hour.  He was given heparin 5000 units subcutaneously for DVT prophylaxis.  Other medication given to the patient include Zofran IV for nausea and albuterol and Atrovent nebs p.r.n. for any shortness of breath.  Pain control was done with Tylenol and hydrocodone.  He was also restarted on some of his home meds which include trazodone 100 mg  p.o. at bedtime, Nexium 40 mg p.o. daily, valacyclovir 500 mg  p.o. q. 24 hours, amlodipine 2 mg p.o. daily, alprazolam 2 mg q. 6 hours, Symbyax 12/25 one p.o. daily.  In course of managing the patient, valacyclovir was discontinued as well as Norvasc. On Mar 09, 2011, the  patient had his IV fluid change to normal saline to go at a  rate of 75 mL an hour.  The patient was stabilized in ICU and subsequently transferred to step-down.  Also involved in the management of this case was that the nephrologist Dr. Arlean Hopping with an impression of acute tubular necrosis and subsequently recommended that the patient should be continued on IV fluid.  The patient was stabilized and transferred to step-down.  He was continued on IV fluid  but because of his low blood pressure the fluid was increased to normal saline 200 mL an hour.  He also had a CVP line inserted to monitor followed intake and was given hydrocortisone 50 mg IV q. 6 hourly to stabilize his blood pressure.  Zyprexa and Xanax was discontinued.  Fluids for stabilization of the patient in ICU was continued and subsequently transferred to step-down. The patient's mentation was said to have improved and thereafter was transferred to general medical floor where he was followed by Dr. Lonia Blood.  On general medical floor, the patient's IV fluid changed to half- normal saline to go at a rate of 200 mL an hour and renal function was monitored on a daily basis.  The patient was seen by me for the very first time in this admission today which is March 10, 2011 and during my encounter, the patient vehemently demanded to be discharged against medical advise and he agreed to signed the paper for AMA.  Attempts at convincing the patient to continue to stay in the hospital for monitoring and stabilization was unsuccessful.  Examination of the patient at this time was essentially unremarkable.  Vital Signs: Blood pressure is 169/92, temperature is 98.3,  pulse 61, respiratory rate 18, clinically stable.  The plan is for the patient to be discharged today against medical advice.  Activity as tolerated.  MEDICATIONS TO BE TAKEN AT HOME: 1. Clonidine 0.1 mg p.o. b.i.d. 2. Amlodipine/benazepril 5/20 one p.o. daily. 3. Crestor (rosuvastatin) 40 mg one p.o. daily at bedtime. 4. Nexium (omeprazole) 40 mg one p.o. daily. 5. Potassium chloride 10 mEq one p.o. daily. 6. Percocet 10/325 one p.o. daily p.r.n. 7. Symbyax (olanzapine/fluoxetine) 12/25 one p.o. b.i.d. 8. Tekturna (aliskiren) 300 mg p.o. daily. 9. Trazodone 100 mg 1 p.o. daily at bedtime. 10.Valacyclovir 500 mg one p.o. b.i.d. 11.Xanax 2 mg one p.o. daily.     Talmage Nap, MD     CN/MEDQ  D:  03/10/2011  T:  03/10/2011  Job:  [id number]  cc:  Archer Asa, M.D.  Electronically Signed by Talmage Nap  on 03/10/2011 02:57:06 PM

## 2011-03-12 LAB — CULTURE, BLOOD (ROUTINE X 2)
Culture  Setup Time: 201205282243
Culture: NO GROWTH

## 2011-03-12 NOTE — Consult Note (Signed)
NAMEJOHNDAVID, GERALDS               ACCOUNT NO.:  [id number]  MEDICAL RECORD NO.:  [id number]           PATIENT TYPE:  LOCATION:                                 FACILITY:  PHYSICIAN:  Maree Krabbe, M.D.DATE OF BIRTH:  1965-11-02  DATE OF CONSULTATION: DATE OF DISCHARGE:                                CONSULTATION   REASON FOR CONSULT:  Acute renal failure.  HISTORY:  The patient is a 45 year old white male with a history of bipolar disorder, hypertension, and TIA.  He was brought to the emergency room by his family, reporting a 4-day history of change in behavior with altered mental status, slurred speech, increased somnolence, and poor oral intake.  His past history is significant for asthma, bipolar disorder, hypertension, and TIA.  He was found to have BUN of 124, creatinine of 16, and sodium of 118 and was transferred to the [facility] for admission by the Hospitalist Service.  The patient was recently started on trazodone, a new medication, on Feb 10, 2011.  He has been taking blood pressure medications including amlodipine, benazepril, Tekturna also.  He currently denies any complaints.  He is not sure why he is here.  He knows he is at [facility], he is not correct about the year.  He denies any recent chest pain, shortness of breath, fever, chills, sweats, vomiting, diarrhea, arthralgia, myalgia, leg swelling, focal numbness or weakness.  The patient does endorse some difficulty with voiding over the past years, occasionally used to have problems with urinary dribbling and took his mother's VESIcare for a while, but he says this was a long time ago.  He denies being diagnosed with any kidney disease in the past.  PAST MEDICAL HISTORY: 1. Hypertension. 2. Bipolar disorder. 3. Asthma. 4. TIA. 5. Chronic back pain.  PAST SURGICAL HISTORY:  None.  MEDICATIONS: 1. Trazodone 100 at bedtime. 2. Nexium 40 once a day. 3. Amlodipine/benazepril 5/20 once a  day. 4. KCl 10 mEq daily. 5. Crestor 40 at bedtime. 6. Endocet 10/325 as needed. 7. Symbyax which is a combination of olanzapine and fluoxetine 12/25     b.i.d. 8. Tekturna 300 mg once a day. 9. Valacyclovir 500 b.i.d. 10.Alprazolam 2 mg q.i.d.  ALLERGIES:  None.  SOCIAL HISTORY:  Married, lives with his wife and his father.  No alcohol, tobacco, or drug use.  REVIEW OF SYSTEMS:  As above.  FAMILY HISTORY:  Noncontributory.  PHYSICAL EXAMINATION:  VITAL SIGNS:  Blood pressure 103/40 on presentation, pulse 70, respirations 18, temperature 97.6. GENERAL:  This is an awake, partially oriented, adult male in no distress, well developed, well nourished, moderately obese. SKIN:  Warm and dry without rash, cyanosis, or edema. HEENT:  PERRLA, EOMI.  Throat is clear and moist. NECK:  Supple with JVP about 10-12 cm. CHEST:  Clear throughout.  No rales, rhonchi, or wheezing. CARDIAC:  Regular rate and rhythm without murmur, rub, or gallop. ABDOMEN:  Obese, nontender, nondistended.  No ascites or organomegaly. GU:  Normal male genitalia.  A Foley catheter was just placed after the patient voided and over 1000 mL of urine  have come out rapidly. EXTREMITIES:  Trace edema in the left ankle only, no edema elsewhere. Pulses in the feet are strong.  There are no signs of any ischemic changes in the feet. NEUROLOGIC:  Slurred speech, nonfocal motor exam.  Pleasant and fully responsive.  LABORATORY DATA:  Sodium 118, BUN 124, creatinine 16.5, potassium 4.2, CO2 of 21.  Hemoglobin 11, white blood count 7000.  Urinalysis, clean catch was 100 protein, 7-10 red blood cells, 11-20 white blood cells, and positive for bacteria.  Chest x-ray has just been done.  Head CT was negative.  IMPRESSION: 1. Acute kidney failure due to urinary retention.  He may have     underlying benign prostatic hypertrophy exacerbated by his     medications with a recent addition of trazodone as well as ACE      inhibitor and direct renin inhibitor.  He has low blood pressure as     well.  Over 1000 mL of urine came out immediately when the Foley     was placed after voiding.  Expect the patient will recover     quickly from this episode of acute renal failure with simply     placement of Foley catheter.  Hold blood pressure medications     at this time and any other possibly sedating meds, give normal     saline at 100 mL/hour, and keep the Foley catheter in place.  There     is no need for acute dialysis. 2. Hyponatremic, either hypovolemic or euvolemic.  Agree with IV     fluids normal saline at 100 mL/hour.  Followup BMET every 4 hours     until the serum sodium is up over 130.  Avoid overly rapid     correction. 3. Bipolar disorder. 4. Hypertension.  Blood pressure low, hold meds. 5. Altered mental status due to uremia.  Should resolve promptly.    Maree Krabbe, M.D.    RDS/MEDQ  D:  03/05/2011  T:  03/06/2011  Job:  [id number]  Electronically Signed by Delano Metz M.D. on 03/12/2011 12:51:45 PM

## 2011-03-14 LAB — CULTURE, BLOOD (ROUTINE X 2): Culture  Setup Time: 201205282243

## 2011-04-14 ENCOUNTER — Telehealth: Payer: Self-pay | Admitting: Cardiovascular Disease

## 2011-04-14 NOTE — Telephone Encounter (Addendum)
ROI faxed to [facility] to obtain Records for this NP.Marland Kitchenappointment 05/04/11/km  Records received from [facility] gave to [facility]  04/25/11/km

## 2011-05-03 ENCOUNTER — Encounter: Payer: Self-pay | Admitting: Cardiovascular Disease

## 2011-05-04 ENCOUNTER — Encounter: Payer: Self-pay | Admitting: Cardiovascular Disease

## 2011-05-04 ENCOUNTER — Ambulatory Visit (INDEPENDENT_AMBULATORY_CARE_PROVIDER_SITE_OTHER): Payer: Medicaid Other | Admitting: Cardiovascular Disease

## 2011-05-04 VITALS — BP 133/82 | HR 106 | Ht 71.0 in | Wt 280.0 lb

## 2011-05-04 DIAGNOSIS — I10 Essential (primary) hypertension: Secondary | ICD-10-CM

## 2011-05-04 DIAGNOSIS — R0989 Other specified symptoms and signs involving the circulatory and respiratory systems: Secondary | ICD-10-CM

## 2011-05-04 DIAGNOSIS — R0609 Other forms of dyspnea: Secondary | ICD-10-CM

## 2011-05-04 DIAGNOSIS — R0600 Dyspnea, unspecified: Secondary | ICD-10-CM | POA: Insufficient documentation

## 2011-05-04 NOTE — Assessment & Plan Note (Signed)
Will check echo to assess LV function.

## 2011-05-04 NOTE — Assessment & Plan Note (Signed)
Stable. No changes in therapy.  

## 2011-05-04 NOTE — Progress Notes (Signed)
History of Present Illness:45 yo WM HTN, seizure disorder, asthma, bipolar disorder, TIA here today to establish cardiac care. He has been followed in the past for evaluation of chest pain by Dr. Lavonne Chick. He has had a prior stress test five years ago that was normal. He has no chest pain. He does report dyspnea with exertion. No near syncope or syncope.   His primary care is Dr. Maryellen Pile, [facility].   Past Medical History  Diagnosis Date  . Bipolar disorder, unspecified   . Hypertension   . Chest pain   . Syncope   . Asthma   . Seizure     Past Surgical History  Procedure Date  . Left shoulder surgery     Current Outpatient Prescriptions  Medication Sig Dispense Refill  . aliskiren (TEKTURNA) 150 MG tablet Take 150 mg by mouth daily.        Marland Kitchen ALPRAZolam (XANAX) 0.25 MG tablet Take 0.25 mg by mouth at bedtime as needed.        Marland Kitchen amLODipine-benazepril (LOTREL) 5-20 MG per capsule Take 1 capsule by mouth daily.        Marland Kitchen aspirin 81 MG tablet Take 81 mg by mouth daily.        Marland Kitchen esomeprazole (NEXIUM) 40 MG capsule Take 40 mg by mouth daily before breakfast.        . oxyCODONE-acetaminophen (PERCOCET) 10-650 MG per tablet Take 1 tablet by mouth every 6 (six) hours as needed.        Marland Kitchen QUEtiapine (SEROQUEL) 200 MG tablet Take 200 mg by mouth 2 (two) times daily.          Allergies  Allergen Reactions  . Codeine   . Lithium     History   Social History  . Marital Status: Married    Spouse Name: N/A    Number of Children: 2  . Years of Education: N/A   Occupational History  . disability     disabled due to bipolar disorder   Social History Main Topics  . Smoking status: Former Smoker -- 0.2 packs/day    Quit date: 10/10/2003  . Smokeless tobacco: Not on file  . Alcohol Use: Yes     Heavy use, attended AA in the past, no longer attending  . Drug Use: Not on file  . Sexually Active: Not on file   Other Topics Concern  . Not on file   Social History  Narrative  . No narrative on file    Family History  Problem Relation Age of Onset  . Coronary artery disease Father   . Coronary artery disease Other     Uncle  . Stroke Other     uncle    Review of Systems:  As stated in the HPI and otherwise negative.   BP 133/82  Pulse 106  Ht 5\' 11"  (1.803 m)  Wt 280 lb (127.007 kg)  BMI 39.05 kg/m2  Physical Examination: General: Well developed, well nourished, NAD HEENT: OP clear, mucus membranes moist SKIN: warm, dry. No rashes. Neuro: No focal deficits Musculoskeletal: Muscle strength 5/5 all ext Psychiatric: Mood and affect normal Neck: No JVD, no carotid bruits, no thyromegaly, no lymphadenopathy. Lungs:Clear bilaterally, no wheezes, rhonci, crackles Cardiovascular: Regular rate and rhythm. No murmurs, gallops or rubs. Abdomen:Soft. Bowel sounds present. Non-tender.  Extremities: No lower extremity edema. Pulses are 2 + in the bilateral DP/PT.  ZOX:WRUEA tachycardia, rate 106 bpm.

## 2011-05-04 NOTE — Patient Instructions (Signed)
Your physician recommends that you schedule a follow-up appointment in: 6 months  Your physician has requested that you have an echocardiogram. Echocardiography is a painless test that uses sound waves to create images of your heart. It provides your doctor with information about the size and shape of your heart and how well your heart's chambers and valves are working. This procedure takes approximately one hour. There are no restrictions for this procedure.    

## 2011-05-19 ENCOUNTER — Encounter: Payer: Self-pay | Admitting: Cardiovascular Disease

## 2011-05-22 ENCOUNTER — Other Ambulatory Visit (HOSPITAL_COMMUNITY): Payer: Medicaid Other | Admitting: Radiology

## 2011-05-25 ENCOUNTER — Other Ambulatory Visit (HOSPITAL_COMMUNITY): Payer: Medicaid Other | Admitting: Radiology

## 2011-07-06 LAB — BASIC METABOLIC PANEL
BUN: 8
CO2: 31
Calcium: 9.2
Chloride: 103
Creatinine, Ser: 0.78
GFR calc Af Amer: >60
GFR calc non Af Amer: >60
Glucose, Bld: 110 — ABNORMAL HIGH
Potassium: 4
Sodium: 136

## 2011-07-06 LAB — RAPID URINE DRUG SCREEN, HOSP PERFORMED
Amphetamines: NOT DETECTED
Barbiturates: NOT DETECTED
Benzodiazepines: POSITIVE — AB
Cocaine: NOT DETECTED
Opiates: NOT DETECTED
Tetrahydrocannabinol: NOT DETECTED

## 2011-07-06 LAB — URINALYSIS, ROUTINE W REFLEX MICROSCOPIC
Bilirubin Urine: NEGATIVE
Glucose, UA: NEGATIVE
Hgb urine dipstick: NEGATIVE
Ketones, ur: NEGATIVE
Nitrite: NEGATIVE
Protein, ur: NEGATIVE
Specific Gravity, Urine: 1.01
Urobilinogen, UA: 0.2
pH: 5.5

## 2011-07-06 LAB — ETHANOL: Alcohol, Ethyl (B): <5

## 2011-07-06 LAB — DIFFERENTIAL
Basophils Absolute: 0
Basophils Relative: 0
Eosinophils Absolute: 0.1
Eosinophils Relative: 1
Lymphocytes Relative: 27
Lymphs Abs: 1.3
Monocytes Absolute: 0.3
Monocytes Relative: 7
Neutro Abs: 3.2
Neutrophils Relative %: 65

## 2011-07-06 LAB — HEPATIC FUNCTION PANEL
ALT: 26
AST: 25
Albumin: 3.9
Alkaline Phosphatase: 38 — ABNORMAL LOW
Bilirubin, Direct: 0.1
Indirect Bilirubin: 0.7
Total Bilirubin: 0.8
Total Protein: 6.1

## 2011-07-06 LAB — CBC
HCT: 38.4 — ABNORMAL LOW
Hemoglobin: 13.7
MCHC: 35.6
MCV: 86.4
Platelets: 225
RBC: 4.45
RDW: 12.5
WBC: 4.9

## 2011-07-19 LAB — COMPREHENSIVE METABOLIC PANEL
ALT: 25
AST: 18
Albumin: 4.2
Alkaline Phosphatase: 50
BUN: 12
CO2: 27
Calcium: 9.5
Chloride: 108
Creatinine, Ser: 0.81
GFR calc Af Amer: >60
GFR calc non Af Amer: >60
Glucose, Bld: 125 — ABNORMAL HIGH
Potassium: 3.6
Sodium: 141
Total Bilirubin: 0.7
Total Protein: 6.6

## 2011-07-19 LAB — DIFFERENTIAL
Basophils Absolute: 0.1
Basophils Relative: 1
Eosinophils Absolute: 0.1
Eosinophils Relative: 1
Lymphocytes Relative: 23
Lymphs Abs: 1.8
Monocytes Absolute: 0.3
Monocytes Relative: 4
Neutro Abs: 5.5
Neutrophils Relative %: 71

## 2011-07-19 LAB — CBC
HCT: 42.3
Hemoglobin: 14.8
MCHC: 34.9
MCV: 89
Platelets: 244
RBC: 4.76
RDW: 12
WBC: 7.8

## 2011-07-20 LAB — RAPID URINE DRUG SCREEN, HOSP PERFORMED
Amphetamines: NOT DETECTED
Barbiturates: NOT DETECTED
Benzodiazepines: POSITIVE — AB
Cocaine: NOT DETECTED
Opiates: POSITIVE — AB
Tetrahydrocannabinol: POSITIVE — AB

## 2011-07-20 LAB — CBC
HCT: 45.3
Hemoglobin: 16
MCHC: 35.3
MCV: 89.8
Platelets: 218
RBC: 5.04
RDW: 12
WBC: 5.7

## 2011-07-20 LAB — DIFFERENTIAL
Basophils Absolute: 0.1
Basophils Relative: 1
Eosinophils Absolute: 0.1
Eosinophils Relative: 1
Lymphocytes Relative: 22
Lymphs Abs: 1.2
Monocytes Absolute: 0.5
Monocytes Relative: 8
Neutro Abs: 3.9
Neutrophils Relative %: 68

## 2011-07-24 LAB — BASIC METABOLIC PANEL
BUN: 14
CO2: 26
Calcium: 9
Chloride: 105
Creatinine, Ser: 1.11
GFR calc Af Amer: >60
GFR calc non Af Amer: >60
Glucose, Bld: 108 — ABNORMAL HIGH
Potassium: 4
Sodium: 140

## 2011-07-24 LAB — PROTIME-INR
INR: 1
Prothrombin Time: 12.8

## 2011-07-24 LAB — POCT CARDIAC MARKERS
CKMB, poc: 2.1
Myoglobin, poc: 67
Operator id: 284251
Troponin i, poc: <0.05

## 2011-07-24 LAB — CBC
HCT: 43.4
Hemoglobin: 15.1
MCHC: 34.7
MCV: 90.5
Platelets: 227
RBC: 4.8
RDW: 12.3
WBC: 7.2

## 2011-07-24 LAB — POCT I-STAT CREATININE
Creatinine, Ser: 1.1
Operator id: 284251

## 2011-07-24 LAB — I-STAT 8, (EC8 V) (CONVERTED LAB)
Acid-base deficit: 2
BUN: 15
Bicarbonate: 22.6
Chloride: 106
Glucose, Bld: 90
HCT: 45
Hemoglobin: 15.3
Operator id: 284251
Potassium: 3.6
Sodium: 140
TCO2: 24
pCO2, Ven: 36.9 — ABNORMAL LOW
pH, Ven: 7.396 — ABNORMAL HIGH

## 2011-07-24 LAB — CK TOTAL AND CKMB (NOT AT ARMC)
CK, MB: 3
Relative Index: 1.2
Total CK: 253 — ABNORMAL HIGH

## 2011-07-24 LAB — TROPONIN I: Troponin I: 0.02

## 2011-07-24 LAB — APTT: aPTT: 140 — ABNORMAL HIGH

## 2011-09-24 ENCOUNTER — Emergency Department (HOSPITAL_COMMUNITY)
Admission: EM | Admit: 2011-09-24 | Discharge: 2011-09-24 | Disposition: A | Discharge status: Home / Self Care | Payer: Medicaid Other

## 2015-09-21 ENCOUNTER — Emergency Department (HOSPITAL_COMMUNITY)
Admission: EM | Admit: 2015-09-21 | Discharge: 2015-09-21 | Disposition: A | Discharge status: Home / Self Care | Payer: Medicare Other | Attending: Emergency Medicine | Admitting: Emergency Medicine

## 2015-09-21 ENCOUNTER — Emergency Department (HOSPITAL_COMMUNITY): Payer: Medicare Other

## 2015-09-21 ENCOUNTER — Encounter (HOSPITAL_COMMUNITY): Payer: Self-pay | Admitting: Emergency Medicine

## 2015-09-21 DIAGNOSIS — J45909 Unspecified asthma, uncomplicated: Secondary | ICD-10-CM | POA: Diagnosis not present

## 2015-09-21 DIAGNOSIS — Y9389 Activity, other specified: Secondary | ICD-10-CM | POA: Insufficient documentation

## 2015-09-21 DIAGNOSIS — Y92410 Unspecified street and highway as the place of occurrence of the external cause: Secondary | ICD-10-CM | POA: Diagnosis not present

## 2015-09-21 DIAGNOSIS — Z7982 Long term (current) use of aspirin: Secondary | ICD-10-CM | POA: Diagnosis not present

## 2015-09-21 DIAGNOSIS — S20211A Contusion of right front wall of thorax, initial encounter: Secondary | ICD-10-CM | POA: Diagnosis not present

## 2015-09-21 DIAGNOSIS — M545 Low back pain, unspecified: Secondary | ICD-10-CM

## 2015-09-21 DIAGNOSIS — S3992XA Unspecified injury of lower back, initial encounter: Secondary | ICD-10-CM | POA: Diagnosis not present

## 2015-09-21 DIAGNOSIS — Z87891 Personal history of nicotine dependence: Secondary | ICD-10-CM | POA: Insufficient documentation

## 2015-09-21 DIAGNOSIS — S29002A Unspecified injury of muscle and tendon of back wall of thorax, initial encounter: Secondary | ICD-10-CM | POA: Diagnosis not present

## 2015-09-21 DIAGNOSIS — M546 Pain in thoracic spine: Secondary | ICD-10-CM

## 2015-09-21 DIAGNOSIS — Z79899 Other long term (current) drug therapy: Secondary | ICD-10-CM | POA: Insufficient documentation

## 2015-09-21 DIAGNOSIS — I10 Essential (primary) hypertension: Secondary | ICD-10-CM | POA: Diagnosis not present

## 2015-09-21 DIAGNOSIS — Y998 Other external cause status: Secondary | ICD-10-CM | POA: Diagnosis not present

## 2015-09-21 DIAGNOSIS — S199XXA Unspecified injury of neck, initial encounter: Secondary | ICD-10-CM | POA: Diagnosis not present

## 2015-09-21 DIAGNOSIS — F319 Bipolar disorder, unspecified: Secondary | ICD-10-CM | POA: Insufficient documentation

## 2015-09-21 DIAGNOSIS — M542 Cervicalgia: Secondary | ICD-10-CM

## 2015-09-21 MED ORDER — OXYCODONE-ACETAMINOPHEN 5-325 MG PO TABS
1.0000 | ORAL_TABLET | Freq: Once | ORAL | Status: AC
  Administered 2015-09-21: 1 via ORAL
  Filled 2015-09-21: qty 1

## 2015-09-21 MED ORDER — OXYCODONE-ACETAMINOPHEN 5-325 MG PO TABS: 1.0000 | ORAL_TABLET | ORAL | Status: DC | PRN

## 2015-09-21 MED ORDER — CYCLOBENZAPRINE HCL 10 MG PO TABS: 10.0000 mg | ORAL_TABLET | Freq: Two times a day (BID) | ORAL | Status: DC | PRN

## 2015-09-21 NOTE — Discharge Instructions (Signed)
X-ray showed no fractures. You will be sore for several days. Medication for pain and muscle spasm.

## 2015-09-21 NOTE — ED Notes (Signed)
C-collar placed on patient.
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
## 2015-09-21 NOTE — ED Notes (Signed)
Pt sat up in bed, took c collar off and threw it behind stretcher, took off bp cuff, and  slammed canned drink on mayo stand.  Pt upset because he said he was laying here for 3 hours.   Pt left ambulatory.
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
## 2015-10-10 ENCOUNTER — Emergency Department (HOSPITAL_COMMUNITY): Payer: Medicare Other

## 2015-10-10 ENCOUNTER — Encounter (HOSPITAL_COMMUNITY): Payer: Self-pay | Admitting: Emergency Medicine

## 2015-10-10 ENCOUNTER — Emergency Department (HOSPITAL_COMMUNITY)
Admission: EM | Admit: 2015-10-10 | Discharge: 2015-10-10 | Disposition: A | Discharge status: Home / Self Care | Payer: Medicare Other | Attending: Emergency Medicine | Admitting: Emergency Medicine

## 2015-10-10 DIAGNOSIS — F319 Bipolar disorder, unspecified: Secondary | ICD-10-CM | POA: Diagnosis not present

## 2015-10-10 DIAGNOSIS — Z7982 Long term (current) use of aspirin: Secondary | ICD-10-CM | POA: Diagnosis not present

## 2015-10-10 DIAGNOSIS — S299XXS Unspecified injury of thorax, sequela: Secondary | ICD-10-CM | POA: Insufficient documentation

## 2015-10-10 DIAGNOSIS — F17210 Nicotine dependence, cigarettes, uncomplicated: Secondary | ICD-10-CM | POA: Diagnosis not present

## 2015-10-10 DIAGNOSIS — I10 Essential (primary) hypertension: Secondary | ICD-10-CM | POA: Insufficient documentation

## 2015-10-10 DIAGNOSIS — J45901 Unspecified asthma with (acute) exacerbation: Secondary | ICD-10-CM | POA: Insufficient documentation

## 2015-10-10 DIAGNOSIS — Z79899 Other long term (current) drug therapy: Secondary | ICD-10-CM | POA: Insufficient documentation

## 2015-10-10 DIAGNOSIS — S299XXD Unspecified injury of thorax, subsequent encounter: Secondary | ICD-10-CM

## 2015-10-10 DIAGNOSIS — J9801 Acute bronchospasm: Secondary | ICD-10-CM

## 2015-10-10 DIAGNOSIS — Z8669 Personal history of other diseases of the nervous system and sense organs: Secondary | ICD-10-CM | POA: Diagnosis not present

## 2015-10-10 DIAGNOSIS — Z885 Allergy status to narcotic agent status: Secondary | ICD-10-CM | POA: Insufficient documentation

## 2015-10-10 DIAGNOSIS — R05 Cough: Secondary | ICD-10-CM | POA: Diagnosis present

## 2015-10-10 DIAGNOSIS — R52 Pain, unspecified: Secondary | ICD-10-CM

## 2015-10-10 DIAGNOSIS — Z720 Tobacco use: Secondary | ICD-10-CM

## 2015-10-10 MED ORDER — AEROCHAMBER Z-STAT PLUS/MEDIUM MISC
1.0000 | Freq: Once | Status: AC
  Administered 2015-10-10: 1

## 2015-10-10 MED ORDER — ALBUTEROL SULFATE HFA 108 (90 BASE) MCG/ACT IN AERS
2.0000 | INHALATION_SPRAY | RESPIRATORY_TRACT | Status: DC | PRN
  Administered 2015-10-10: 2 via RESPIRATORY_TRACT
  Filled 2015-10-10: qty 6.7

## 2015-10-10 MED ORDER — OXYCODONE-ACETAMINOPHEN 5-325 MG PO TABS
1.0000 | ORAL_TABLET | Freq: Once | ORAL | Status: AC
  Administered 2015-10-10: 1 via ORAL
  Filled 2015-10-10: qty 1

## 2015-10-10 MED ORDER — OXYCODONE-ACETAMINOPHEN 5-325 MG PO TABS: 1.0000 | ORAL_TABLET | ORAL | Status: DC | PRN

## 2015-10-10 NOTE — ED Notes (Signed)
Pt reports productive cough, back pain that increases with cough, and general malaise.

## 2015-10-10 NOTE — ED Notes (Signed)
Resp therapy called

## 2015-10-10 NOTE — Discharge Instructions (Signed)
Use the inhaler 2 puffs every 3 hours as needed for cough or trouble breathing. Use heat on the sore area daily at bedtime 3 or 4 times a day. Follow-up with a primary care doctor for checkup in one week.    Bronchospasm, Adult A bronchospasm is when the tubes that carry air in and out of your lungs (airways) spasm or tighten. During a bronchospasm it is hard to breathe. This is because the airways get smaller. A bronchospasm can be triggered by:  Allergies. These may be to animals, pollen, food, or mold.  Infection. This is a common cause of bronchospasm.  Exercise.  Irritants. These include pollution, cigarette smoke, strong odors, aerosol sprays, and paint fumes.  Weather changes.  Stress.  Being emotional. HOME CARE   Always have a plan for getting help. Know when to call your doctor and local emergency services (911 in the [place] Know where you can get emergency care.  Only take medicines as told by your doctor.  If you were prescribed an inhaler or nebulizer machine, ask your doctor how to use it correctly. Always use a spacer with your inhaler if you were given one.  Stay calm during an attack. Try to relax and breathe more slowly.  Control your home environment:  Change your heating and air conditioning filter at least once a month.  Limit your use of fireplaces and wood stoves.  Do not  smoke. Do not  allow smoking in your home.  Avoid perfumes and fragrances.  Get rid of pests (such as roaches and mice) and their droppings.  Throw away plants if you see mold on them.  Keep your house clean and dust free.  Replace carpet with wood, tile, or vinyl flooring. Carpet can trap dander and dust.  Use allergy-proof pillows, mattress covers, and box spring covers.  Wash bed sheets and blankets every week in hot water. Dry them in a dryer.  Use blankets that are made of polyester or cotton.  Wash hands frequently. GET HELP IF:  You have muscle aches.  You  have chest pain.  The thick spit you spit or cough up (sputum) changes from clear or white to yellow, green, gray, or bloody.  The thick spit you spit or cough up gets thicker.  There are problems that may be related to the medicine you are given such as:  A rash.  Itching.  Swelling.  Trouble breathing. GET HELP RIGHT AWAY IF:  You feel you cannot breathe or catch your breath.  You cannot stop coughing.  Your treatment is not helping you breathe better.  You have very bad chest pain. MAKE SURE YOU:   Understand these instructions.  Will watch your condition.  Will get help right away if you are not doing well or get worse.   This information is not intended to replace advice given to you by your health care provider. Make sure you discuss any questions you have with your health care provider.   Document Released: 07/23/2009 Document Revised: 10/16/2014 Document Reviewed: 03/18/2013 Elsevier Interactive Patient Education 2016 Elsevier Inc.  [employer] Therapy Heat therapy can help ease sore, stiff, injured, and tight muscles and joints. Heat relaxes your muscles, which may help ease your pain.  RISKS AND COMPLICATIONS If you have any of the following conditions, do not use heat therapy unless your health care provider has approved:  Poor circulation.  Healing wounds or scarred skin in the area being treated.  Diabetes, heart disease, or high  blood pressure.  Not being able to feel (numbness) the area being treated.  Unusual swelling of the area being treated.  Active infections.  Blood clots.  Cancer.  Inability to communicate pain. This may include young children and people who have problems with their brain function (dementia).  Pregnancy. Heat therapy should only be used on old, pre-existing, or long-lasting (chronic) injuries. Do not use heat therapy on new injuries unless directed by your health care provider. HOW TO USE HEAT THERAPY There are several  different kinds of heat therapy, including:  Moist heat pack.  Warm water bath.  Hot water bottle.  Electric heating pad.  Heated gel pack.  Heated wrap.  Electric heating pad. Use the heat therapy method suggested by your health care provider. Follow your health care provider's instructions on when and how to use heat therapy. GENERAL HEAT THERAPY RECOMMENDATIONS  Do not sleep while using heat therapy. Only use heat therapy while you are awake.  Your skin may turn pink while using heat therapy. Do not use heat therapy if your skin turns red.  Do not use heat therapy if you have new pain.  High heat or long exposure to heat can cause burns. Be careful when using heat therapy to avoid burning your skin.  Do not use heat therapy on areas of your skin that are already irritated, such as with a rash or sunburn. SEEK MEDICAL CARE IF:  You have blisters, redness, swelling, or numbness.  You have new pain.  Your pain is worse. MAKE SURE YOU:  Understand these instructions.  Will watch your condition.  Will get help right away if you are not doing well or get worse.   This information is not intended to replace advice given to you by your health care provider. Make sure you discuss any questions you have with your health care provider.   Document Released: 12/18/2011 Document Revised: 10/16/2014 Document Reviewed: 11/18/2013 Elsevier Interactive Patient Education 2016 [employer].  Smoking Cessation, Tips for Success If you are ready to quit smoking, congratulations! You have chosen to help yourself be healthier. Cigarettes bring nicotine, tar, carbon monoxide, and other irritants into your body. Your lungs, heart, and blood vessels will be able to work better without these poisons. There are many different ways to quit smoking. Nicotine gum, nicotine patches, a nicotine inhaler, or nicotine nasal spray can help with physical craving. Hypnosis, support groups, and medicines  help break the habit of smoking. WHAT THINGS CAN I DO TO MAKE QUITTING EASIER?  Here are some tips to help you quit for good:  Pick a date when you will quit smoking completely. Tell all of your friends and family about your plan to quit on that date.  Do not try to slowly cut down on the number of cigarettes you are smoking. Pick a quit date and quit smoking completely starting on that day.  Throw away all cigarettes.   Clean and remove all ashtrays from your home, work, and car.  On a card, write down your reasons for quitting. Carry the card with you and read it when you get the urge to smoke.  Cleanse your body of nicotine. Drink enough water and fluids to keep your urine clear or pale yellow. Do this after quitting to flush the nicotine from your body.  Learn to predict your moods. Do not let a bad situation be your excuse to have a cigarette. Some situations in your life might tempt you into wanting a  cigarette.  Never have "just one" cigarette. It leads to wanting another and another. Remind yourself of your decision to quit.  Change habits associated with smoking. If you smoked while driving or when feeling stressed, try other activities to replace smoking. Stand up when drinking your coffee. Brush your teeth after eating. Sit in a different chair when you read the paper. Avoid alcohol while trying to quit, and try to drink fewer caffeinated beverages. Alcohol and caffeine may urge you to smoke.  Avoid foods and drinks that can trigger a desire to smoke, such as sugary or spicy foods and alcohol.  Ask people who smoke not to smoke around you.  Have something planned to do right after eating or having a cup of coffee. For example, plan to take a walk or exercise.  Try a relaxation exercise to calm you down and decrease your stress. Remember, you may be tense and nervous for the first 2 weeks after you quit, but this will pass.  Find new activities to keep your hands busy. Play  with a pen, coin, or rubber band. Doodle or draw things on paper.  Brush your teeth right after eating. This will help cut down on the craving for the taste of tobacco after meals. You can also try mouthwash.   Use oral substitutes in place of cigarettes. Try using lemon drops, carrots, cinnamon sticks, or chewing gum. Keep them handy so they are available when you have the urge to smoke.  When you have the urge to smoke, try deep breathing.  Designate your home as a nonsmoking area.  If you are a heavy smoker, ask your health care provider about a prescription for nicotine chewing gum. It can ease your withdrawal from nicotine.  Reward yourself. Set aside the cigarette money you save and buy yourself something nice.  Look for support from others. Join a support group or smoking cessation program. Ask someone at home or at work to help you with your plan to quit smoking.  Always ask yourself, "Do I need this cigarette or is this just a reflex?" Tell yourself, "Today, I choose not to smoke," or "I do not want to smoke." You are reminding yourself of your decision to quit.  Do not replace cigarette smoking with electronic cigarettes (commonly called e-cigarettes). The safety of e-cigarettes is unknown, and some may contain harmful chemicals.  If you relapse, do not give up! Plan ahead and think about what you will do the next time you get the urge to smoke. HOW WILL I FEEL WHEN I QUIT SMOKING? You may have symptoms of withdrawal because your body is used to nicotine (the addictive substance in cigarettes). You may crave cigarettes, be irritable, feel very hungry, cough often, get headaches, or have difficulty concentrating. The withdrawal symptoms are only temporary. They are strongest when you first quit but will go away within 10-14 days. When withdrawal symptoms occur, stay in control. Think about your reasons for quitting. Remind yourself that these are signs that your body is healing and  getting used to being without cigarettes. Remember that withdrawal symptoms are easier to treat than the major diseases that smoking can cause.  Even after the withdrawal is over, expect periodic urges to smoke. However, these cravings are generally short lived and will go away whether you smoke or not. Do not smoke! WHAT RESOURCES ARE AVAILABLE TO HELP ME QUIT SMOKING? Your health care provider can direct you to community resources or hospitals for support, which may  include:  Group support.  Education.  Hypnosis.  Therapy.   This information is not intended to replace advice given to you by your health care provider. Make sure you discuss any questions you have with your health care provider.   Document Released: 06/23/2004 Document Revised: 10/16/2014 Document Reviewed: 03/13/2013 Elsevier Interactive Patient Education 2016 [employer]  Steps to Quit Smoking  Smoking tobacco can be harmful to your health and can affect almost every organ in your body. Smoking puts you, and those around you, at risk for developing many serious chronic diseases. Quitting smoking is difficult, but it is one of the best things that you can do for your health. It is never too late to quit. WHAT ARE THE BENEFITS OF QUITTING SMOKING? When you quit smoking, you lower your risk of developing serious diseases and conditions, such as:  Lung cancer or lung disease, such as COPD.  Heart disease.  Stroke.  Heart attack.  Infertility.  Osteoporosis and bone fractures. Additionally, symptoms such as coughing, wheezing, and shortness of breath may get better when you quit. You may also find that you get sick less often because your body is stronger at fighting off colds and infections. If you are pregnant, quitting smoking can help to reduce your chances of having a baby of low birth weight. HOW DO I GET READY TO QUIT? When you decide to quit smoking, create a plan to make sure that you are successful.  Before you quit:  Pick a date to quit. Set a date within the next two weeks to give you time to prepare.  Write down the reasons why you are quitting. Keep this list in places where you will see it often, such as on your bathroom mirror or in your car or wallet.  Identify the people, places, things, and activities that make you want to smoke (triggers) and avoid them. Make sure to take these actions:  Throw away all cigarettes at home, at work, and in your car.  Throw away smoking accessories, such as Set designerashtrays and lighters.  Clean your car and make sure to empty the ashtray.  Clean your home, including curtains and carpets.  Tell your family, friends, and coworkers that you are quitting. Support from your loved ones can make quitting easier.  Talk with your health care provider about your options for quitting smoking.  Find out what treatment options are covered by your health insurance. WHAT STRATEGIES CAN I USE TO QUIT SMOKING?  Talk with your healthcare provider about different strategies to quit smoking. Some strategies include:  Quitting smoking altogether instead of gradually lessening how much you smoke over a period of time. Research shows that quitting "cold [place]" is more successful than gradually quitting.  Attending in-person counseling to help you build problem-solving skills. You are more likely to have success in quitting if you attend several counseling sessions. Even short sessions of 10 minutes can be effective.  Finding resources and support systems that can help you to quit smoking and remain smoke-free after you quit. These resources are most helpful when you use them often. They can include:  Online chats with a Veterinary surgeoncounselor.  Telephone quitlines.  Printed Materials engineerself-help materials.  Support groups or group counseling.  Text messaging programs.  Mobile phone applications.  Taking medicines to help you quit smoking. (If you are pregnant or breastfeeding, talk  with your health care provider first.) Some medicines contain nicotine and some do not. Both types of medicines help with cravings, but  the medicines that include nicotine help to relieve withdrawal symptoms. Your health care provider may recommend:  Nicotine patches, gum, or lozenges.  Nicotine inhalers or sprays.  Non-nicotine medicine that is taken by mouth. Talk with your health care provider about combining strategies, such as taking medicines while you are also receiving in-person counseling. Using these two strategies together makes you more likely to succeed in quitting than if you used either strategy on its own. If you are pregnant or breastfeeding, talk with your health care provider about finding counseling or other support strategies to quit smoking. Do not take medicine to help you quit smoking unless told to do so by your health care provider. WHAT THINGS CAN I DO TO MAKE IT EASIER TO QUIT? Quitting smoking might feel overwhelming at first, but there is a lot that you can do to make it easier. Take these important actions:  Reach out to your family and friends and ask that they support and encourage you during this time. Call telephone quitlines, reach out to support groups, or work with a counselor for support.  Ask people who smoke to avoid smoking around you.  Avoid places that trigger you to smoke, such as bars, parties, or smoke-break areas at work.  Spend time around people who do not smoke.  Lessen stress in your life, because stress can be a smoking trigger for some people. To lessen stress, try:  Exercising regularly.  Deep-breathing exercises.  Yoga.  Meditating.  Performing a body scan. This involves closing your eyes, scanning your body from head to toe, and noticing which parts of your body are particularly tense. Purposefully relax the muscles in those areas.  Download or purchase mobile phone or tablet apps (applications) that can help you stick to your  quit plan by providing reminders, tips, and encouragement. There are many free apps, such as QuitGuide from the [employer] developer for Disease Control and Prevention). You can find other support for quitting smoking (smoking cessation) through smokefree.gov and other websites. HOW WILL I FEEL WHEN I QUIT SMOKING? Within the first 24 hours of quitting smoking, you may start to feel some withdrawal symptoms. These symptoms are usually most noticeable 2-3 days after quitting, but they usually do not last beyond 2-3 weeks. Changes or symptoms that you might experience include:  Mood swings.  Restlessness, anxiety, or irritation.  Difficulty concentrating.  Dizziness.  Strong cravings for sugary foods in addition to nicotine.  Mild weight gain.  Constipation.  Nausea.  Coughing or a sore throat.  Changes in how your medicines work in your body.  A depressed mood.  Difficulty sleeping (insomnia). After the first 2-3 weeks of quitting, you may start to notice more positive results, such as:  Improved sense of smell and taste.  Decreased coughing and sore throat.  Slower heart rate.  Lower blood pressure.  Clearer skin.  The ability to breathe more easily.  Fewer sick days. Quitting smoking is very challenging for most people. Do not get discouraged if you are not successful the first time. Some people need to make many attempts to quit before they achieve long-term success. Do your best to stick to your quit plan, and talk with your health care provider if you have any questions or concerns.   This information is not intended to replace advice given to you by your health care provider. Make sure you discuss any questions you have with your health care provider.   Document Released: 09/19/2001 Document Revised: 02/09/2015 Document  Reviewed: 02/09/2015 Elsevier Interactive Patient Education 2016 [employer].  Smoking Hazards Smoking cigarettes is extremely bad for your health.  Tobacco smoke has over 200 known poisons in it. It contains the poisonous gases nitrogen oxide and carbon monoxide. There are over 60 chemicals in tobacco smoke that cause cancer. Some of the chemicals found in cigarette smoke include:   Cyanide.   Benzene.   Formaldehyde.   Methanol (wood alcohol).   Acetylene (fuel used in welding torches).   Ammonia.  Even smoking lightly shortens your life expectancy by several years. You can greatly reduce the risk of medical problems for you and your family by stopping now. Smoking is the most preventable cause of death and disease in our society. Within days of quitting smoking, your circulation improves, you decrease the risk of having a heart attack, and your lung capacity improves. There may be some increased phlegm in the first few days after quitting, and it may take months for your lungs to clear up completely. Quitting for 10 years reduces your risk of developing lung cancer to almost that of a nonsmoker.  WHAT ARE THE RISKS OF SMOKING? Cigarette smokers have an increased risk of many serious medical problems, including:  Lung cancer.   Lung disease (such as pneumonia, bronchitis, and emphysema).   Heart attack and chest pain due to the heart not getting enough oxygen (angina).   Heart disease and peripheral blood vessel disease.   Hypertension.   Stroke.   Oral cancer (cancer of the lip, mouth, or voice box).   Bladder cancer.   Pancreatic cancer.   Cervical cancer.   Pregnancy complications, including premature birth.   Stillbirths and smaller newborn babies, birth defects, and genetic damage to sperm.   Early menopause.   Lower estrogen level for women.   Infertility.   Facial wrinkles.   Blindness.   Increased risk of broken bones (fractures).   Senile dementia.   Stomach ulcers and internal bleeding.   Delayed wound healing and increased risk of complications during  surgery. Because of secondhand smoke exposure, children of smokers have an increased risk of the following:   Sudden infant death syndrome (SIDS).   Respiratory infections.   Lung cancer.   Heart disease.   Ear infections.  WHY IS SMOKING ADDICTIVE? Nicotine is the chemical agent in tobacco that is capable of causing addiction or dependence. When you smoke and inhale, nicotine is absorbed rapidly into the bloodstream through your lungs. Both inhaled and noninhaled nicotine may be addictive.  WHAT ARE THE BENEFITS OF QUITTING?  There are many health benefits to quitting smoking. Some are:   The likelihood of developing cancer and heart disease decreases. Health improvements are seen almost immediately.   Blood pressure, pulse rate, and breathing patterns start returning to normal soon after quitting.   People who quit may see an improvement in their overall quality of life.  HOW DO YOU QUIT SMOKING? Smoking is an addiction with both physical and psychological effects, and longtime habits can be hard to change. Your health care provider can recommend:  Programs and community resources, which may include group support, education, or therapy.  Replacement products, such as patches, gum, and nasal sprays. Use these products only as directed. Do not replace cigarette smoking with electronic cigarettes (commonly called e-cigarettes). The safety of e-cigarettes is unknown, and some may contain harmful chemicals. FOR MORE INFORMATION  American Lung Association: www.lung.org  American Cancer Society: www.cancer.org   This information is not intended  to replace advice given to you by your health care provider. Make sure you discuss any questions you have with your health care provider.   Document Released: 11/02/2004 Document Revised: 07/16/2013 Document Reviewed: 03/17/2013 Elsevier Interactive Patient Education [employer]
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
## 2015-10-24 ENCOUNTER — Emergency Department (HOSPITAL_COMMUNITY)
Admission: EM | Admit: 2015-10-24 | Discharge: 2015-10-24 | Disposition: A | Discharge status: Home / Self Care | Payer: Medicare Other | Attending: Emergency Medicine | Admitting: Emergency Medicine

## 2015-10-24 ENCOUNTER — Encounter (HOSPITAL_COMMUNITY): Payer: Self-pay | Admitting: Emergency Medicine

## 2015-10-24 ENCOUNTER — Emergency Department (HOSPITAL_COMMUNITY): Payer: Medicare Other

## 2015-10-24 DIAGNOSIS — F319 Bipolar disorder, unspecified: Secondary | ICD-10-CM | POA: Insufficient documentation

## 2015-10-24 DIAGNOSIS — Z87442 Personal history of urinary calculi: Secondary | ICD-10-CM | POA: Diagnosis not present

## 2015-10-24 DIAGNOSIS — Z79899 Other long term (current) drug therapy: Secondary | ICD-10-CM | POA: Insufficient documentation

## 2015-10-24 DIAGNOSIS — M545 Low back pain: Secondary | ICD-10-CM | POA: Insufficient documentation

## 2015-10-24 DIAGNOSIS — J45909 Unspecified asthma, uncomplicated: Secondary | ICD-10-CM | POA: Insufficient documentation

## 2015-10-24 DIAGNOSIS — R109 Unspecified abdominal pain: Secondary | ICD-10-CM | POA: Diagnosis not present

## 2015-10-24 DIAGNOSIS — I10 Essential (primary) hypertension: Secondary | ICD-10-CM | POA: Diagnosis not present

## 2015-10-24 DIAGNOSIS — Z7984 Long term (current) use of oral hypoglycemic drugs: Secondary | ICD-10-CM | POA: Insufficient documentation

## 2015-10-24 DIAGNOSIS — Z7982 Long term (current) use of aspirin: Secondary | ICD-10-CM | POA: Diagnosis not present

## 2015-10-24 DIAGNOSIS — F17210 Nicotine dependence, cigarettes, uncomplicated: Secondary | ICD-10-CM | POA: Insufficient documentation

## 2015-10-24 LAB — COMPREHENSIVE METABOLIC PANEL
ALT: 31 U/L (ref 17–63)
AST: 29 U/L (ref 15–41)
Albumin: 4.2 g/dL (ref 3.5–5.0)
Alkaline Phosphatase: 70 U/L (ref 38–126)
Anion gap: 10 (ref 5–15)
BUN: 14 mg/dL (ref 6–20)
CO2: 25 mmol/L (ref 22–32)
Calcium: 9.2 mg/dL (ref 8.9–10.3)
Chloride: 104 mmol/L (ref 101–111)
Creatinine, Ser: 0.79 mg/dL (ref 0.61–1.24)
GFR calc Af Amer: >60 mL/min (ref >60)
GFR calc non Af Amer: >60 mL/min (ref >60)
Glucose, Bld: 135 mg/dL — ABNORMAL HIGH (ref 65–99)
Potassium: 3.4 mmol/L — ABNORMAL LOW (ref 3.5–5.1)
Sodium: 139 mmol/L (ref 135–145)
Total Bilirubin: 1.2 mg/dL (ref 0.3–1.2)
Total Protein: 6.9 g/dL (ref 6.5–8.1)

## 2015-10-24 LAB — URINE MICROSCOPIC-ADD ON
RBC / HPF: NONE SEEN RBC/hpf (ref 0–5)
Squamous Epithelial / LPF: NONE SEEN

## 2015-10-24 LAB — CBC WITH DIFFERENTIAL/PLATELET
Basophils Absolute: 0.1 10*3/uL (ref 0.0–0.1)
Basophils Relative: 1 %
Eosinophils Absolute: 0.1 10*3/uL (ref 0.0–0.7)
Eosinophils Relative: 2 %
HCT: 43.5 % (ref 39.0–52.0)
Hemoglobin: 14.5 g/dL (ref 13.0–17.0)
Lymphocytes Relative: 41 %
Lymphs Abs: 2.9 10*3/uL (ref 0.7–4.0)
MCH: 29.2 pg (ref 26.0–34.0)
MCHC: 33.3 g/dL (ref 30.0–36.0)
MCV: 87.5 fL (ref 78.0–100.0)
Monocytes Absolute: 0.5 10*3/uL (ref 0.1–1.0)
Monocytes Relative: 7 %
Neutro Abs: 3.5 10*3/uL (ref 1.7–7.7)
Neutrophils Relative %: 49 %
Platelets: 204 10*3/uL (ref 150–400)
RBC: 4.97 MIL/uL (ref 4.22–5.81)
RDW: 12.1 % (ref 11.5–15.5)
WBC: 7 10*3/uL (ref 4.0–10.5)

## 2015-10-24 LAB — URINALYSIS, ROUTINE W REFLEX MICROSCOPIC
Glucose, UA: 100 mg/dL — AB
Hgb urine dipstick: NEGATIVE
Ketones, ur: NEGATIVE mg/dL
Leukocytes, UA: NEGATIVE
Nitrite: POSITIVE — AB
Protein, ur: 30 mg/dL — AB
Specific Gravity, Urine: >1.03 — ABNORMAL HIGH (ref 1.005–1.030)
pH: 5.5 (ref 5.0–8.0)

## 2015-10-24 LAB — CBG MONITORING, ED: Glucose-Capillary: 112 mg/dL — ABNORMAL HIGH (ref 65–99)

## 2015-10-24 MED ORDER — METHOCARBAMOL 500 MG PO TABS: 500.0000 mg | ORAL_TABLET | Freq: Two times a day (BID) | ORAL | Status: DC

## 2015-10-24 MED ORDER — DOXYCYCLINE HYCLATE 100 MG PO CAPS: 100.0000 mg | ORAL_CAPSULE | Freq: Two times a day (BID) | ORAL | Status: DC

## 2015-10-24 MED ORDER — HYDROCODONE-ACETAMINOPHEN 5-325 MG PO TABS: 2.0000 | ORAL_TABLET | ORAL | Status: DC | PRN

## 2015-10-24 NOTE — Discharge Instructions (Signed)

## 2015-10-24 NOTE — ED Notes (Signed)
Pt exhibits slow, slurred speech, states that he needs breakfast and something to drink.  Advised pt that he just arrived to the ED and that we cannot do this until he is evaluated for a cause of his pain.  Pt states that he needs IV pain medication then. Advised pt that the physician would have to evaluate him first.  Pt states that he knew he should have gone to another hospital that gives "good pain meds".  Pt out of bed walking in hallway.

## 2015-10-24 NOTE — ED Provider Notes (Signed)
CSN: [id number]     Arrival date & time 10/24/15  [id number] History   First MD Initiated Contact with Patient 10/24/15 [phone]     Chief Complaint  Patient presents with  . Flank Pain     (Consider location/radiation/quality/duration/timing/severity/associated sxs/prior Treatment) Patient is a 50 y.o. male presenting with flank pain.  Flank Pain This is a new problem. The current episode started in the past 7 days. The problem has been unchanged. Associated symptoms include urinary symptoms. Nothing aggravates the symptoms. He has tried nothing for the symptoms. The treatment provided moderate relief.  Pt worried about his kidneys.  Pt reports he had a kidney swell and fail from a stone.  Pt reports he is now having a similar pain in his back  Past Medical History  Diagnosis Date  . Bipolar disorder, unspecified (HCC)   . Hypertension   . Chest pain   . Syncope   . Asthma   . Seizure [facility](HCC)    Past Surgical History  Procedure Laterality Date  . Left shoulder surgery     Family History  Problem Relation Age of Onset  . Coronary artery disease Father   . Heart disease Father   . Coronary artery disease Other     Uncle  . Stroke Other     uncle   Social History  Substance Use Topics  . Smoking status: Current Every Day Smoker -- 0.50 packs/day    Types: Cigarettes    Last Attempt to Quit: 10/10/2003  . Smokeless tobacco: None  . Alcohol Use: No     Comment: Heavy use    Review of Systems  Genitourinary: Positive for flank pain.  All other systems reviewed and are negative.     Allergies  Codeine and Lithium  Home Medications   Prior to Admission medications   Medication Sig Start Date End Date Taking? Authorizing Provider  alprazolam Prudy Feeler(XANAX) 2 MG tablet Take 2 mg by mouth 4 (four) times daily as needed for anxiety.   Yes Historical Provider, MD  amLODipine-benazepril (LOTREL) 10-20 MG capsule Take 1 capsule by mouth daily.   Yes Historical Provider, MD  aspirin 81  MG tablet Take 81 mg by mouth daily.     Yes Historical Provider, MD  ibuprofen (ADVIL,MOTRIN) 200 MG tablet Take 400 mg by mouth every 6 (six) hours as needed for mild pain.   Yes Historical Provider, MD  metFORMIN (GLUCOPHAGE) 500 MG tablet Take 1 tablet by mouth 2 (two) times daily. 10/12/15  Yes Historical Provider, MD  Multiple Vitamin (MULTIVITAMIN WITH MINERALS) TABS tablet Take 1 tablet by mouth daily.   Yes Historical Provider, MD  omeprazole (PRILOSEC) 40 MG capsule Take 1 capsule by mouth daily. 10/12/15  Yes Historical Provider, MD  PROAIR HFA 108 (90 Base) MCG/ACT inhaler Inhale 1-2 puffs into the lungs every 6 (six) hours as needed for wheezing or shortness of breath.  10/13/15  Yes Historical Provider, MD  zolpidem (AMBIEN) 10 MG tablet Take 1 tablet by mouth at bedtime as needed for sleep.  10/19/15  Yes Historical Provider, MD  methocarbamol (ROBAXIN) 500 MG tablet Take 1 tablet (500 mg total) by mouth 2 (two) times daily. 10/24/15   Elson AreasLeslie K Sofia, PA-C  oxyCODONE-acetaminophen (PERCOCET) 5-325 MG tablet Take 1 tablet by mouth every 4 (four) hours as needed. Patient not taking: Reported on 10/24/2015 10/10/15   Mancel BaleElliott Wentz, MD   BP 102/78 mmHg  Pulse 105  Temp(Src) 97.8 F (36.6 C) (Oral)  Resp  20  Ht 6' (1.829 m)  Wt 115.667 kg  BMI 34.58 kg/m2  SpO2 97% Physical Exam  Constitutional: He appears well-developed and well-nourished.  HENT:  Head: Normocephalic.  Right Ear: External ear normal.  Left Ear: External ear normal.  Nose: Nose normal.  Mouth/Throat: Oropharynx is clear and moist.  Eyes: Conjunctivae and EOM are normal. Pupils are equal, round, and reactive to light.  Neck: Normal range of motion. Neck supple.  Cardiovascular: Normal rate and normal heart sounds.   Pulmonary/Chest: Effort normal and breath sounds normal.  Abdominal: Soft.  Musculoskeletal:  Tender right flank,   Skin: Skin is warm.  Psychiatric: He has a normal mood and affect.  Nursing note and  vitals reviewed.   ED Course  Procedures (including critical care time) Labs Review Labs Reviewed  COMPREHENSIVE METABOLIC PANEL - Abnormal; Notable for the following:    Potassium 3.4 (*)    Glucose, Bld 135 (*)    All other components within normal limits  URINALYSIS, ROUTINE W REFLEX MICROSCOPIC (NOT AT [facility]) - Abnormal; Notable for the following:    Color, Urine ORANGE (*)    APPearance HAZY (*)    Specific Gravity, Urine >1.030 (*)    Glucose, UA 100 (*)    Bilirubin Urine SMALL (*)    Protein, ur 30 (*)    Nitrite POSITIVE (*)    All other components within normal limits  URINE MICROSCOPIC-ADD ON - Abnormal; Notable for the following:    Bacteria, UA MANY (*)    All other components within normal limits  CBG MONITORING, ED - Abnormal; Notable for the following:    Glucose-Capillary 112 (*)    All other components within normal limits  CBC WITH DIFFERENTIAL/PLATELET    Imaging Review Ct Renal Stone Study  10/24/2015  CLINICAL DATA:  Right flank pain EXAM: CT ABDOMEN AND PELVIS WITHOUT CONTRAST TECHNIQUE: Multidetector CT imaging of the abdomen and pelvis was performed following the standard protocol without IV contrast. COMPARISON:  03/09/2005 FINDINGS: No hydronephrosis.  No urinary calculus. Diffuse hepatic steatosis with relative sparing adjacent to the gallbladder. Gallbladder, spleen, pancreas, adrenal glands are within normal limits Normal appendix. No free-fluid. No compression deformity. IMPRESSION: No evidence of urinary obstruction or calculus. Electronically Signed   By: Jolaine Click M.D.   On: 10/24/2015 11:55   I have personally reviewed and evaluated these images and lab results as part of my medical decision-making.   EKG Interpretation None      MDM  Pt counseled on protein and bacteria in his urine.  Pt given rx for doxycycline.  Pt advised to see his MD for recheck    Final diagnoses:  Right flank pain  Low back pain, unspecified back pain  laterality, with sciatica presence unspecified    Meds ordered this encounter  Medications  . PROAIR HFA 108 (90 Base) MCG/ACT inhaler    Sig: Inhale 1-2 puffs into the lungs every 6 (six) hours as needed for wheezing or shortness of breath.   . metFORMIN (GLUCOPHAGE) 500 MG tablet    Sig: Take 1 tablet by mouth 2 (two) times daily.  Marland Kitchen omeprazole (PRILOSEC) 40 MG capsule    Sig: Take 1 capsule by mouth daily.  Marland Kitchen zolpidem (AMBIEN) 10 MG tablet    Sig: Take 1 tablet by mouth at bedtime as needed for sleep.   . methocarbamol (ROBAXIN) 500 MG tablet    Sig: Take 1 tablet (500 mg total) by mouth 2 (two) times daily.  Dispense:  20 tablet    Refill:  0    Order Specific Question:  Supervising Provider    Answer:  MILLER, BRIAN [[id number]]  . doxycycline (VIBRAMYCIN) 100 MG capsule    Sig: Take 1 capsule (100 mg total) by mouth 2 (two) times daily.    Dispense:  20 capsule    Refill:  0    Order Specific Question:  Supervising Provider    Answer:  MILLER, BRIAN [[id number]]  . HYDROcodone-acetaminophen (NORCO/VICODIN) 5-325 MG tablet    Sig: Take 2 tablets by mouth every 4 (four) hours as needed.    Dispense:  10 tablet    Refill:  0    Order Specific Question:  Supervising Provider    Answer:  Eber Hong [[id number]]     Lonia Skinner Athalia, PA-C 10/24/15 1537  Donnetta Hutching, MD 10/27/15 1539
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
## 2016-07-04 ENCOUNTER — Ambulatory Visit: Payer: Medicare Other | Admitting: Cardiology

## 2016-07-06 ENCOUNTER — Encounter (HOSPITAL_COMMUNITY): Payer: Self-pay | Admitting: Emergency Medicine

## 2016-07-06 ENCOUNTER — Emergency Department (HOSPITAL_COMMUNITY): Payer: Medicare Other

## 2016-07-06 ENCOUNTER — Emergency Department (HOSPITAL_COMMUNITY)
Admission: EM | Admit: 2016-07-06 | Discharge: 2016-07-07 | Disposition: A | Discharge status: Home / Self Care | Payer: Medicare Other | Attending: Emergency Medicine | Admitting: Emergency Medicine

## 2016-07-06 DIAGNOSIS — Z79899 Other long term (current) drug therapy: Secondary | ICD-10-CM | POA: Insufficient documentation

## 2016-07-06 DIAGNOSIS — R4781 Slurred speech: Secondary | ICD-10-CM | POA: Diagnosis not present

## 2016-07-06 DIAGNOSIS — Z5181 Encounter for therapeutic drug level monitoring: Secondary | ICD-10-CM | POA: Diagnosis not present

## 2016-07-06 DIAGNOSIS — J45909 Unspecified asthma, uncomplicated: Secondary | ICD-10-CM | POA: Diagnosis not present

## 2016-07-06 DIAGNOSIS — F17210 Nicotine dependence, cigarettes, uncomplicated: Secondary | ICD-10-CM | POA: Diagnosis not present

## 2016-07-06 DIAGNOSIS — Z7982 Long term (current) use of aspirin: Secondary | ICD-10-CM | POA: Diagnosis not present

## 2016-07-06 DIAGNOSIS — I10 Essential (primary) hypertension: Secondary | ICD-10-CM | POA: Insufficient documentation

## 2016-07-06 LAB — PROTIME-INR
INR: 0.96
Prothrombin Time: 12.8 seconds (ref 11.4–15.2)

## 2016-07-06 LAB — COMPREHENSIVE METABOLIC PANEL
ALT: 39 U/L (ref 17–63)
AST: 24 U/L (ref 15–41)
Albumin: 3.8 g/dL (ref 3.5–5.0)
Alkaline Phosphatase: 78 U/L (ref 38–126)
Anion gap: 9 (ref 5–15)
BUN: 10 mg/dL (ref 6–20)
CO2: 26 mmol/L (ref 22–32)
Calcium: 9.2 mg/dL (ref 8.9–10.3)
Chloride: 105 mmol/L (ref 101–111)
Creatinine, Ser: 0.71 mg/dL (ref 0.61–1.24)
GFR calc Af Amer: >60 mL/min (ref >60)
GFR calc non Af Amer: >60 mL/min (ref >60)
Glucose, Bld: 116 mg/dL — ABNORMAL HIGH (ref 65–99)
Potassium: 3.2 mmol/L — ABNORMAL LOW (ref 3.5–5.1)
Sodium: 140 mmol/L (ref 135–145)
Total Bilirubin: 0.6 mg/dL (ref 0.3–1.2)
Total Protein: 6.2 g/dL — ABNORMAL LOW (ref 6.5–8.1)

## 2016-07-06 LAB — RAPID URINE DRUG SCREEN, HOSP PERFORMED
Amphetamines: NOT DETECTED
Barbiturates: NOT DETECTED
Benzodiazepines: POSITIVE — AB
Cocaine: NOT DETECTED
Opiates: POSITIVE — AB
Tetrahydrocannabinol: NOT DETECTED

## 2016-07-06 LAB — CBC
HCT: 44.3 % (ref 39.0–52.0)
Hemoglobin: 15.5 g/dL (ref 13.0–17.0)
MCH: 30.2 pg (ref 26.0–34.0)
MCHC: 35 g/dL (ref 30.0–36.0)
MCV: 86.2 fL (ref 78.0–100.0)
Platelets: 248 10*3/uL (ref 150–400)
RBC: 5.14 MIL/uL (ref 4.22–5.81)
RDW: 12.4 % (ref 11.5–15.5)
WBC: 8.2 10*3/uL (ref 4.0–10.5)

## 2016-07-06 LAB — URINALYSIS, ROUTINE W REFLEX MICROSCOPIC
Glucose, UA: >1000 mg/dL — AB
Hgb urine dipstick: NEGATIVE
Ketones, ur: NEGATIVE mg/dL
Leukocytes, UA: NEGATIVE
Nitrite: NEGATIVE
Protein, ur: NEGATIVE mg/dL
Specific Gravity, Urine: 1.036 — ABNORMAL HIGH (ref 1.005–1.030)
pH: 6 (ref 5.0–8.0)

## 2016-07-06 LAB — URINE MICROSCOPIC-ADD ON: RBC / HPF: NONE SEEN RBC/hpf (ref 0–5)

## 2016-07-06 LAB — SALICYLATE LEVEL: Salicylate Lvl: <4 mg/dL (ref 2.8–30.0)

## 2016-07-06 LAB — I-STAT CHEM 8, ED
BUN: 12 mg/dL (ref 6–20)
Calcium, Ion: 1.06 mmol/L — ABNORMAL LOW (ref 1.15–1.40)
Chloride: 104 mmol/L (ref 101–111)
Creatinine, Ser: 0.7 mg/dL (ref 0.61–1.24)
Glucose, Bld: 112 mg/dL — ABNORMAL HIGH (ref 65–99)
HCT: 45 % (ref 39.0–52.0)
Hemoglobin: 15.3 g/dL (ref 13.0–17.0)
Potassium: 3.2 mmol/L — ABNORMAL LOW (ref 3.5–5.1)
Sodium: 143 mmol/L (ref 135–145)
TCO2: 27 mmol/L (ref 0–100)

## 2016-07-06 LAB — DIFFERENTIAL
Basophils Absolute: 0.1 10*3/uL (ref 0.0–0.1)
Basophils Relative: 1 %
Eosinophils Absolute: 0.2 10*3/uL (ref 0.0–0.7)
Eosinophils Relative: 2 %
Lymphocytes Relative: 46 %
Lymphs Abs: 3.9 10*3/uL (ref 0.7–4.0)
Monocytes Absolute: 0.4 10*3/uL (ref 0.1–1.0)
Monocytes Relative: 5 %
Neutro Abs: 3.7 10*3/uL (ref 1.7–7.7)
Neutrophils Relative %: 46 %

## 2016-07-06 LAB — APTT: aPTT: 25 seconds (ref 24–36)

## 2016-07-06 LAB — I-STAT TROPONIN, ED: Troponin i, poc: 0.01 ng/mL (ref 0.00–0.08)

## 2016-07-06 LAB — ETHANOL: Alcohol, Ethyl (B): <5 mg/dL (ref <5)

## 2016-07-06 LAB — ACETAMINOPHEN LEVEL: Acetaminophen (Tylenol), Serum: <10 ug/mL — ABNORMAL LOW (ref 10–30)

## 2016-07-06 NOTE — Progress Notes (Signed)
Mr. [name] is a 50 year old male from home. Pt [facility] with abnormal gait and AMS. Pt reports he has not slept in 4 days due to taking care of his father. CBG 127 LKW 1900. Pt took his prescribed Gabapentin, Xanax, and Ambien prior to going to bed tonight. Pt to have MRI of brain and discharge home if results are negative.

## 2016-07-06 NOTE — ED Provider Notes (Signed)
MSE was initiated and I personally evaluated the patient and placed orders (if any) at  9:28 PM on July 06, 2016.  The patient appears stable so that the remainder of the MSE may be completed by another provider.  Medical arrival at the bridge. Able to speak. Appears stable for CT. Met also by neurology.   Davonna Belling, MD 07/06/16 2128

## 2016-07-06 NOTE — ED Notes (Signed)
Attempted to call pt wife, no response.

## 2016-07-06 NOTE — ED Notes (Signed)
Called main to add on blood
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
## 2016-07-06 NOTE — ED Provider Notes (Signed)
[facility]-EMERGENCY DEPT Provider Note   CSN: [id number] Arrival date & time: 07/06/16  2123     History   Chief Complaint Chief Complaint  Patient presents with  . Code Stroke    HPI [name]. is a 50 y.o. male.  HPI 50 year old male who presents as a code stroke. He has a history of bipolar disorder, hypertension, and diabetes. According to EMS his last known normal was 1900 when his wife noticed that he was having slurred speech. Per EMS they also stated that he may have had right-sided deficits with facial droop, but his wife states that she did not notice this and mainly noticed that he was very sleepy, had slurred speech, and had staggering gait. He states that he took Xanax, Ambien and pain medications earlier tonight to help him sleep. On arrival to ED denies any focal numbness or weakness, vision changes, headache, chest pain or difficulty breathing. Past Medical History:  Diagnosis Date  . Asthma   . Bipolar disorder, unspecified (HCC)   . Chest pain   . Hypertension   . Seizure (HCC)   . Syncope     Patient Active Problem List   Diagnosis Date Noted  . Dyspnea 05/04/2011  . HTN (hypertension) 05/04/2011    Past Surgical History:  Procedure Laterality Date  . Left shoulder surgery         Home Medications    Prior to Admission medications   Medication Sig Start Date End Date Taking? Authorizing Provider  alprazolam Prudy Feeler) 2 MG tablet Take 2 mg by mouth 4 (four) times daily as needed for anxiety.   Yes Historical Provider, MD  amLODipine-benazepril (LOTREL) 10-20 MG capsule Take 1 capsule by mouth daily.   Yes Historical Provider, MD  aspirin 81 MG tablet Take 81 mg by mouth 2 (two) times daily.    Yes Historical Provider, MD  buprenorphine-naloxone (SUBOXONE) 8-2 MG SUBL SL tablet Place 1 tablet under the tongue 3 (three) times daily.   Yes Historical Provider, MD  insulin aspart (NOVOLOG) 100 UNIT/ML injection Inject into the skin 3 (three) times  daily before meals.   Yes Historical Provider, MD  Multiple Vitamin (MULTIVITAMIN WITH MINERALS) TABS tablet Take 1 tablet by mouth daily.   Yes Historical Provider, MD  omeprazole (PRILOSEC) 40 MG capsule Take 1 capsule by mouth daily. 10/12/15  Yes Historical Provider, MD  zolpidem (AMBIEN) 10 MG tablet Take 10 mg by mouth at bedtime as needed for sleep.   Yes Historical Provider, MD  doxycycline (VIBRAMYCIN) 100 MG capsule Take 1 capsule (100 mg total) by mouth 2 (two) times daily. Patient not taking: Reported on 07/06/2016 10/24/15   Elson Areas, PA-C  HYDROcodone-acetaminophen (NORCO/VICODIN) 5-325 MG tablet Take 2 tablets by mouth every 4 (four) hours as needed. Patient not taking: Reported on 07/06/2016 10/24/15   Elson Areas, PA-C  methocarbamol (ROBAXIN) 500 MG tablet Take 1 tablet (500 mg total) by mouth 2 (two) times daily. Patient not taking: Reported on 07/06/2016 10/24/15   Elson Areas, PA-C  oxyCODONE-acetaminophen (PERCOCET) 5-325 MG tablet Take 1 tablet by mouth every 4 (four) hours as needed. Patient not taking: Reported on 07/06/2016 10/10/15   Mancel Bale, MD    Family History Family History  Problem Relation Age of Onset  . Coronary artery disease Father   . Heart disease Father   . Coronary artery disease Other     Uncle  . Stroke Other     uncle  Social History Social History  Substance Use Topics  . Smoking status: Current Every Day Smoker    Packs/day: 0.50    Types: Cigarettes    Last attempt to quit: 10/10/2003  . Smokeless tobacco: Not on file  . Alcohol use No     Comment: Heavy use     Allergies   Lithium and Codeine   Review of Systems Review of Systems 10/14 systems reviewed and are negative other than those stated in the HPI   Physical Exam Updated Vital Signs BP 141/89 (BP Location: Right Arm)   Pulse 79   Temp 98.4 F (36.9 C)   Resp (!) 27   Ht 6' (1.829 m)   Wt 253 lb 4.9 oz (114.9 kg)   SpO2 97%   BMI 34.35 kg/m    Physical Exam Physical Exam  Nursing note and vitals reviewed. Constitutional: Appears intoxicated, slurred speech, non-toxic, and in no acute distress Head: Normocephalic and atraumatic.  Mouth/Throat: Oropharynx is clear and moist.  Neck: Normal range of motion. Neck supple.  Cardiovascular: Normal rate and regular rhythm.   Pulmonary/Chest: Effort normal and breath sounds normal.  Abdominal: Soft. There is no tenderness. There is no rebound and no guarding.  Musculoskeletal: Normal range of motion.  Skin: Skin is warm and dry.  Psychiatric: Cooperative Neurological:  Alert, oriented to person, place, time, and situation. Memory grossly in tact. Slurred speech. No dysarthria or aphasia.  Cranial nerves:  Pupils are symmetric, and reactive to light. EOMI without nystagmus. No gaze deviation. Facial muscles symmetric with activation. Sensation to light touch over face in tact bilaterally. Hearing grossly in tact. Palate elevates symmetrically. Head turn and shoulder shrug are intact. Tongue midline.  Reflexes defered.  Muscle bulk and tone normal. No pronator drift. Moves all extremities symmetrically. Sensation to light touch is in tact throughout in bilateral upper and lower extremities. Coordination reveals no dysmetria with finger to nose.     ED Treatments / Results  Labs (all labs ordered are listed, but only abnormal results are displayed) Labs Reviewed  COMPREHENSIVE METABOLIC PANEL - Abnormal; Notable for the following:       Result Value   Potassium 3.2 (*)    Glucose, Bld 116 (*)    Total Protein 6.2 (*)    All other components within normal limits  URINE RAPID DRUG SCREEN, HOSP PERFORMED - Abnormal; Notable for the following:    Opiates POSITIVE (*)    Benzodiazepines POSITIVE (*)    All other components within normal limits  URINALYSIS, ROUTINE W REFLEX MICROSCOPIC (NOT AT [facility]) - Abnormal; Notable for the following:    Color, Urine AMBER (*)    Specific  Gravity, Urine 1.036 (*)    Glucose, UA >1000 (*)    Bilirubin Urine SMALL (*)    All other components within normal limits  ACETAMINOPHEN LEVEL - Abnormal; Notable for the following:    Acetaminophen (Tylenol), Serum <10 (*)    All other components within normal limits  URINE MICROSCOPIC-ADD ON - Abnormal; Notable for the following:    Squamous Epithelial / LPF 0-5 (*)    Bacteria, UA RARE (*)    All other components within normal limits  I-STAT CHEM 8, ED - Abnormal; Notable for the following:    Potassium 3.2 (*)    Glucose, Bld 112 (*)    Calcium, Ion 1.06 (*)    All other components within normal limits  ETHANOL  PROTIME-INR  APTT  CBC  DIFFERENTIAL  SALICYLATE LEVEL  I-STAT TROPOININ, ED    EKG  EKG Interpretation  Date/Time:  Thursday July 06 2016 21:37:26 EDT Ventricular Rate:  89 PR Interval:    QRS Duration: 99 QT Interval:  351 QTC Calculation: 427 R Axis:   50 Text Interpretation:  Sinus rhythm Baseline wander in lead(s) V2 no acute changes  Confirmed by Kinsly Hild MD, Annabelle Harman [phone]) on 07/06/2016 10:24:47 PM       Radiology Mr Brain Wo Contrast  Result Date: 07/07/2016 CLINICAL DATA:  Slurred speech, facial droop, RIGHT-sided deficits, last seen normal at 1900 hours. History of hypertension and seizures. EXAM: MRI HEAD WITHOUT CONTRAST TECHNIQUE: Multiplanar, multiecho pulse sequences of the brain and surrounding structures were obtained without intravenous contrast. COMPARISON:  CT HEAD July 06, 2014 at 2135 hours and MRI of the head July 30, 2007 FINDINGS: Multiple sequences are mildly motion degraded. BRAIN: No reduced diffusion to suggest acute ischemia. No susceptibility artifact to suggest hemorrhage. The ventricles and sulci are normal for patient's age ; cavum septum pellucidum et vergae is a normal variant. No suspicious parenchymal signal, masses or mass effect. No abnormal extra-axial fluid collections. No extra-axial masses though, contrast  enhanced sequences would be more sensitive. VASCULAR: Normal major intracranial vascular flow voids present at skull base. SKULL AND UPPER CERVICAL SPINE: No abnormal sellar expansion. No suspicious calvarial bone marrow signal. Craniocervical junction maintained. SINUSES/ORBITS: Mild maxillary sinus mucosal thickening. Mastoid air cells are well aerated. The included ocular globes and orbital contents are non-suspicious. OTHER: None. IMPRESSION: No acute intracranial process ; negative mildly motion degraded MRI head. Electronically Signed   By: Awilda Metro M.D.   On: 07/07/2016 00:39   Ct Head Code Stroke W/o Cm  Result Date: 07/06/2016 CLINICAL DATA:  Code stroke.  Slurred speech EXAM: CT HEAD WITHOUT CONTRAST TECHNIQUE: Contiguous axial images were obtained from the base of the skull through the vertex without intravenous contrast. COMPARISON:  None. FINDINGS: Brain: No mass lesion, intraparenchymal hemorrhage or extra-axial collection. No evidence of acute cortical infarct. Brain parenchyma and CSF-containing spaces are normal for age. Vascular: No hyperdense vessel or unexpected calcification. Skull: Normal visualized skull base, calvarium and extracranial soft tissues. Sinuses/Orbits: No sinus fluid levels or advanced mucosal thickening. No mastoid effusion. Normal orbits. ASPECTS [facility] Stroke Program Early CT Score) - Ganglionic level infarction (caudate, lentiform nuclei, internal capsule, insula, M1-M3 cortex): 7 - Supraganglionic infarction (M4-M6 cortex): 3 Total score (0-10 with 10 being normal): 10 IMPRESSION: 1. No acute intracranial abnormality. 2. ASPECTS is 10. These results were called by telephone at the time of interpretation on 07/06/2016 at 10:01 pm to Dr. Amada Jupiter, who verbally acknowledged these results. Electronically Signed   By: Deatra Robinson M.D.   On: 07/06/2016 22:03    Procedures Procedures (including critical care time)  Medications Ordered in ED Medications -  No data to display   Initial Impression / Assessment and Plan / ED Course  I have reviewed the triage vital signs and the nursing notes.  Pertinent labs & imaging results that were available during my care of the patient were reviewed by me and considered in my medical decision making (see chart for details).  Clinical Course    Patient initially activated as code stroke by EMS. On exam he is appearing almost intoxicated with slurred speech. He otherwise has no focal deficits on exam. CT head is without acute intracranial processes such as intracranial hemorrhage. Dr. Amada Jupiter from neurology also present for evaluation and suspicion for acute  CVA is low and he is not a candidate for TPA. He did recommend MRI of the brain. Presentation seems more consistent with that of polypharmacy. His MRI does not show acute infarct. His tox screen is positive for benzos, opiates, and he also endorse taking Ambien tonight. Remainder of tox screen negative. No evidence of infection. No major metabolic or electrolyte derangements. Observed in ED and able to ambulate steadily, tolerating by mouth, and is baseline according to his wife. He is stable for discharge home.   The patient appears reasonably screened and/or stabilized for discharge and I doubt any other medical condition or other [facility] requiring further screening, evaluation, or treatment in the ED at this time prior to discharge.  Strict return and follow-up instructions reviewed. He expressed understanding of all discharge instructions and felt comfortable with the plan of care.   Final Clinical Impressions(s) / ED Diagnoses   Final diagnoses:  Slurred speech  Polypharmacy    New Prescriptions Discharge Medication List as of 07/07/2016 12:50 AM       Lavera Guise, MD 07/07/16 [phone]

## 2016-07-07 NOTE — Discharge Instructions (Signed)
Please be careful when taking your pain medications, benzodiazepines and sleep medications.   You do not have a stroke today.  Return for worsening symptoms, including fever, inability to walk, new numbness/weakness or any other symptoms concerning to you.

## 2016-07-07 NOTE — ED Notes (Signed)
Pt removed all IVs and sts he put them in the sharps container. Verified that all IVs where removed. 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 Pt walked out w/ significant others.

## 2016-07-07 NOTE — Consult Note (Signed)
Neurology Consultation Reason for Consult: Altered mental status Referring Physician: Verdie MosherLiu, D  CC: Altered mental status  History is obtained from: Patient EMS  HPI: Carlos CarbonJimmy L Welke Jr. is a 50 y.o. male who was in his normal state of health around 7 PM. He states that he took Xanax and Ambien, and then a while later, his wife noticed that he was slurring his words and had an unsteady gait. He has a history of stroke and has persistent slurred speech since then, but is gotten significantly worse. He states that he doesn't think anything serious is going on.  In the course of gathering history, he clearly appears to be unreliable.   LKW: 7 PM tpa given?: no,  mild symptoms  ROS: A 14 point ROS was performed and is negative except as noted in the HPI.  Past Medical History:  Diagnosis Date  . Asthma   . Bipolar disorder, unspecified (HCC)   . Chest pain   . Hypertension   . Seizure (HCC)   . Syncope      Family History  Problem Relation Age of Onset  . Coronary artery disease Father   . Heart disease Father   . Coronary artery disease Other     Uncle  . Stroke Other     uncle     Social History:  reports that he has been smoking Cigarettes.  He has been smoking about 0.50 packs per day. He does not have any smokeless tobacco history on file. He reports that he does not drink alcohol or use drugs.   Exam: Current vital signs: BP (!) 153/104   Pulse 86   Temp 98.4 F (36.9 C)   Resp 18   Ht 6' (1.829 m)   Wt 114.9 kg (253 lb 4.9 oz)   SpO2 99%   BMI 34.35 kg/m  Vital signs in last 24 hours: Temp:  [98.4 F (36.9 C)] 98.4 F (36.9 C) (09/28 2158) Pulse Rate:  [86-91] 86 (09/28 2330) Resp:  [15-18] 18 (09/28 2330) BP: (136-153)/(93-108) 153/104 (09/28 2330) SpO2:  [95 %-99 %] 99 % (09/28 2330) Weight:  [114.9 kg (253 lb 4.9 oz)] 114.9 kg (253 lb 4.9 oz) (09/28 2142)   Physical Exam  Constitutional: Appears well-developed and well-nourished.  Psych: Affect  appropriate to situation Eyes: No scleral injection HENT: No OP obstrucion Head: Normocephalic.  Cardiovascular: Normal rate and regular rhythm.  Respiratory: Effort normal and breath sounds normal to anterior ascultation GI: Soft.  No distension. There is no tenderness.  Skin: WDI  Neuro: Mental Status: Patient is Awake, appears to be intoxicated he does not know the year. No signs of aphasia or neglect Cranial Nerves: II: Visual Fields are full. Pupils are equal, round, and reactive to light.   III,IV, VI: EOMI without ptosis or diploplia.  V: Facial sensation is symmetric to temperature VII: Facial movement is with mild right facial weakness VIII: hearing is intact to voice X: Uvula elevates symmetrically XI: Shoulder shrug is symmetric. XII: tongue is midline without atrophy or fasciculations.  Motor: Tone is normal. Bulk is normal. 5/5 strength was present in all four extremities.  Sensory: Sensation is symmetric to light touch and temperature in the arms and legs. Cerebellar: FNF and HKS are intact bilaterally   I have reviewed labs in epic and the results pertinent to this consultation are: UDS positive for benzos and opiates  I have reviewed the images obtained: CT head-no acute findings  Impression: 50 year old male with a  history of stroke who presents with altered mental status following Ambien and Xanax. Given that he does take these frequently together, I do think it is reasonable to get an MRI, however if this is negative I think that his current presentation be more consistent with intoxication then primary neurological disorder.  Recommendations: 1) MRI brain 2) no further workup unless he does not return to baseline as would be expected with intoxication   Ritta Slot, MD [facility] [phone]  If 7pm- 7am, please page neurology on call as listed in AMION.

## 2016-09-02 ENCOUNTER — Emergency Department (HOSPITAL_COMMUNITY)
Admission: EM | Admit: 2016-09-02 | Discharge: 2016-09-02 | Disposition: A | Discharge status: Home / Self Care | Payer: Medicare Other | Attending: Emergency Medicine | Admitting: Emergency Medicine

## 2016-09-02 ENCOUNTER — Encounter (HOSPITAL_COMMUNITY): Payer: Self-pay

## 2016-09-02 ENCOUNTER — Emergency Department (HOSPITAL_COMMUNITY): Payer: Medicare Other

## 2016-09-02 DIAGNOSIS — Z7982 Long term (current) use of aspirin: Secondary | ICD-10-CM | POA: Insufficient documentation

## 2016-09-02 DIAGNOSIS — J441 Chronic obstructive pulmonary disease with (acute) exacerbation: Secondary | ICD-10-CM | POA: Diagnosis not present

## 2016-09-02 DIAGNOSIS — R0789 Other chest pain: Secondary | ICD-10-CM | POA: Diagnosis present

## 2016-09-02 DIAGNOSIS — I10 Essential (primary) hypertension: Secondary | ICD-10-CM | POA: Insufficient documentation

## 2016-09-02 DIAGNOSIS — J45909 Unspecified asthma, uncomplicated: Secondary | ICD-10-CM | POA: Insufficient documentation

## 2016-09-02 DIAGNOSIS — F17210 Nicotine dependence, cigarettes, uncomplicated: Secondary | ICD-10-CM | POA: Insufficient documentation

## 2016-09-02 DIAGNOSIS — Z79899 Other long term (current) drug therapy: Secondary | ICD-10-CM | POA: Diagnosis not present

## 2016-09-02 DIAGNOSIS — R0602 Shortness of breath: Secondary | ICD-10-CM | POA: Insufficient documentation

## 2016-09-02 LAB — BASIC METABOLIC PANEL
Anion gap: 11 (ref 5–15)
BUN: 7 mg/dL (ref 6–20)
CO2: 23 mmol/L (ref 22–32)
Calcium: 8.9 mg/dL (ref 8.9–10.3)
Chloride: 104 mmol/L (ref 101–111)
Creatinine, Ser: 0.62 mg/dL (ref 0.61–1.24)
GFR calc Af Amer: >60 mL/min (ref >60)
GFR calc non Af Amer: >60 mL/min (ref >60)
Glucose, Bld: 287 mg/dL — ABNORMAL HIGH (ref 65–99)
Potassium: 3.5 mmol/L (ref 3.5–5.1)
Sodium: 138 mmol/L (ref 135–145)

## 2016-09-02 LAB — CBC
HCT: 44.1 % (ref 39.0–52.0)
Hemoglobin: 15.7 g/dL (ref 13.0–17.0)
MCH: 30.1 pg (ref 26.0–34.0)
MCHC: 35.6 g/dL (ref 30.0–36.0)
MCV: 84.6 fL (ref 78.0–100.0)
Platelets: 240 10*3/uL (ref 150–400)
RBC: 5.21 MIL/uL (ref 4.22–5.81)
RDW: 12.7 % (ref 11.5–15.5)
WBC: 8.4 10*3/uL (ref 4.0–10.5)

## 2016-09-02 LAB — D-DIMER, QUANTITATIVE: D-Dimer, Quant: 0.45 ug/mL-FEU (ref 0.00–0.50)

## 2016-09-02 LAB — I-STAT TROPONIN, ED
Troponin i, poc: 0 ng/mL (ref 0.00–0.08)
Troponin i, poc: 0 ng/mL (ref 0.00–0.08)

## 2016-09-02 LAB — LIPASE, BLOOD: Lipase: 15 U/L (ref 11–51)

## 2016-09-02 LAB — BRAIN NATRIURETIC PEPTIDE: B Natriuretic Peptide: 20 pg/mL (ref 0.0–100.0)

## 2016-09-02 MED ORDER — ALBUTEROL SULFATE HFA 108 (90 BASE) MCG/ACT IN AERS: 2.0000 | INHALATION_SPRAY | Freq: Four times a day (QID) | RESPIRATORY_TRACT | 2 refills | Status: AC | PRN

## 2016-09-02 MED ORDER — DOXYCYCLINE HYCLATE 100 MG PO CAPS: 100.0000 mg | ORAL_CAPSULE | Freq: Two times a day (BID) | ORAL | 0 refills | Status: DC

## 2016-09-02 MED ORDER — MAGNESIUM SULFATE 2 GM/50ML IV SOLN
2.0000 g | Freq: Once | INTRAVENOUS | Status: AC
  Administered 2016-09-02: 2 g via INTRAVENOUS
  Filled 2016-09-02: qty 50

## 2016-09-02 MED ORDER — IPRATROPIUM-ALBUTEROL 0.5-2.5 (3) MG/3ML IN SOLN
3.0000 mL | Freq: Once | RESPIRATORY_TRACT | Status: AC
  Administered 2016-09-02: 3 mL via RESPIRATORY_TRACT
  Filled 2016-09-02: qty 3

## 2016-09-02 MED ORDER — ASPIRIN 81 MG PO CHEW
324.0000 mg | CHEWABLE_TABLET | Freq: Once | ORAL | Status: DC
  Filled 2016-09-02: qty 4

## 2016-09-02 MED ORDER — METHYLPREDNISOLONE SODIUM SUCC 125 MG IJ SOLR
125.0000 mg | Freq: Once | INTRAMUSCULAR | Status: AC
  Administered 2016-09-02: 125 mg via INTRAVENOUS
  Filled 2016-09-02: qty 2

## 2016-09-02 MED ORDER — PREDNISONE 10 MG (21) PO TBPK: 10.0000 mg | ORAL_TABLET | Freq: Every day | ORAL | 0 refills | Status: DC

## 2016-09-02 MED ORDER — IPRATROPIUM BROMIDE 0.02 % IN SOLN
0.5000 mg | Freq: Once | RESPIRATORY_TRACT | Status: AC
  Administered 2016-09-02: 0.5 mg via RESPIRATORY_TRACT
  Filled 2016-09-02: qty 2.5

## 2016-09-02 MED ORDER — ALBUTEROL (5 MG/ML) CONTINUOUS INHALATION SOLN
15.0000 mg/h | INHALATION_SOLUTION | RESPIRATORY_TRACT | Status: DC
  Administered 2016-09-02: 15 mg/h via RESPIRATORY_TRACT
  Filled 2016-09-02: qty 20

## 2016-09-02 NOTE — ED Triage Notes (Signed)
Pt reports epigastric pain and up into mid sternal chest that started earlier this evening.  Pt states pain is worse with movement and breathing.

## 2016-09-02 NOTE — Progress Notes (Signed)
Patient given another 10mg  of albuterol via nebulizer Cat

## 2016-09-02 NOTE — ED Notes (Signed)
Pt states he needs to leave because his wife needs to get home b/c she's not feeling well.  Pt encouraged to stay but is insistent on leaving.  Pt signed AMA and agrees to return as needed

## 2016-09-02 NOTE — Discharge Instructions (Signed)
Follow-up with your doctor.  Take your medications as prescribed.  Return to the ED if you develop new or worsening symptoms. °

## 2016-09-02 NOTE — ED Notes (Signed)
Pt states central chest pain. Pt also has audible wheezing.

## 2016-09-02 NOTE — ED Notes (Signed)
Pt ambulated around the nurses station x2. O2 sats ranged from 94 to 96%.

## 2016-09-02 NOTE — ED Notes (Signed)
Pt dumped most of the medicine out when he took mask off. Respiratory called & advised of what happened. States they will come down & add more to treatment.

## 2016-09-02 NOTE — ED Provider Notes (Signed)
[facility] DEPT Provider Note   CSN: [id number] Arrival date & time: 09/02/16  0050     History   Chief Complaint Chief Complaint  Patient presents with  . Chest Pain    HPI Venia CarbonJimmy L Vargus Jr. is a 50 y.o. male.  Patient states he's been having intermittent right-sided chest pain for the past week. The pain starts in his right chest and reduced on his right arm. Last for a few seconds at a time. It comes and goes and happens multiple times a day. He saw his PCP who referred her cardiologist next week. Denies any cardiac history. Has never had a stress test. Patient is visibly short of breath but states that is normal. He states the pain comes on with worsening shortness of breath, nausea but no vomiting. The pain and breathing are better when he sits up and when he goes into a cool space. Denies any leg pain or leg swelling. Patient is attempting to quit smoking. He has been out of his inhalers for about the past month.   The history is provided by the patient.  Chest Pain   Associated symptoms include cough and shortness of breath. Pertinent negatives include no abdominal pain, no dizziness, no fever, no headaches, no nausea, no vomiting and no weakness.    Past Medical History:  Diagnosis Date  . Asthma   . Bipolar disorder, unspecified   . Chest pain   . Hypertension   . Seizure (HCC)   . Syncope     Patient Active Problem List   Diagnosis Date Noted  . Dyspnea 05/04/2011  . HTN (hypertension) 05/04/2011    Past Surgical History:  Procedure Laterality Date  . Left shoulder surgery         Home Medications    Prior to Admission medications   Medication Sig Start Date End Date Taking? Authorizing Provider  alprazolam Prudy Feeler(XANAX) 2 MG tablet Take 2 mg by mouth 4 (four) times daily as needed for anxiety.    Historical Provider, MD  amLODipine-benazepril (LOTREL) 10-20 MG capsule Take 1 capsule by mouth daily.    Historical Provider, MD  aspirin 81 MG tablet  Take 81 mg by mouth 2 (two) times daily.     Historical Provider, MD  buprenorphine-naloxone (SUBOXONE) 8-2 MG SUBL SL tablet Place 1 tablet under the tongue 3 (three) times daily.    Historical Provider, MD  doxycycline (VIBRAMYCIN) 100 MG capsule Take 1 capsule (100 mg total) by mouth 2 (two) times daily. Patient not taking: Reported on 07/06/2016 10/24/15   Elson AreasLeslie K Sofia, PA-C  HYDROcodone-acetaminophen (NORCO/VICODIN) 5-325 MG tablet Take 2 tablets by mouth every 4 (four) hours as needed. Patient not taking: Reported on 07/06/2016 10/24/15   Elson AreasLeslie K Sofia, PA-C  insulin aspart (NOVOLOG) 100 UNIT/ML injection Inject into the skin 3 (three) times daily before meals.    Historical Provider, MD  methocarbamol (ROBAXIN) 500 MG tablet Take 1 tablet (500 mg total) by mouth 2 (two) times daily. Patient not taking: Reported on 07/06/2016 10/24/15   Elson AreasLeslie K Sofia, PA-C  Multiple Vitamin (MULTIVITAMIN WITH MINERALS) TABS tablet Take 1 tablet by mouth daily.    Historical Provider, MD  omeprazole (PRILOSEC) 40 MG capsule Take 1 capsule by mouth daily. 10/12/15   Historical Provider, MD  oxyCODONE-acetaminophen (PERCOCET) 5-325 MG tablet Take 1 tablet by mouth every 4 (four) hours as needed. Patient not taking: Reported on 07/06/2016 10/10/15   Mancel BaleElliott Wentz, MD  zolpidem (AMBIEN) 10 MG tablet  Take 10 mg by mouth at bedtime as needed for sleep.    Historical Provider, MD    Family History Family History  Problem Relation Age of Onset  . Coronary artery disease Father   . Heart disease Father   . Coronary artery disease Other     Uncle  . Stroke Other     uncle    Social History Social History  Substance Use Topics  . Smoking status: Current Every Day Smoker    Packs/day: 0.50    Types: Cigarettes    Last attempt to quit: 10/10/2003  . Smokeless tobacco: Never Used  . Alcohol use No     Comment: Heavy use     Allergies   Lithium and Codeine   Review of Systems Review of Systems    Constitutional: Negative for activity change, appetite change and fever.  HENT: Negative for congestion and rhinorrhea.   Respiratory: Positive for cough, chest tightness and shortness of breath.   Cardiovascular: Positive for chest pain.  Gastrointestinal: Negative for abdominal pain, nausea and vomiting.  Genitourinary: Negative for testicular pain.  Musculoskeletal: Negative for arthralgias and myalgias.  Skin: Negative for rash.  Neurological: Negative for dizziness, weakness, light-headedness and headaches.   A complete 10 system review of systems was obtained and all systems are negative except as noted in the HPI and PMH.    Physical Exam Updated Vital Signs BP (!) 164/105 (BP Location: Left Arm)   Pulse 100   Temp 98.2 F (36.8 C) (Oral)   Resp 22   Ht 6' (1.829 m)   Wt 254 lb (115.2 kg)   SpO2 98%   BMI 34.45 kg/m   Physical Exam  Constitutional: He is oriented to person, place, and time. He appears well-developed and well-nourished. He appears distressed.  Wheezing, dyspneic with conversation  HENT:  Head: Normocephalic and atraumatic.  Mouth/Throat: Oropharynx is clear and moist. No oropharyngeal exudate.  Eyes: Conjunctivae and EOM are normal. Pupils are equal, round, and reactive to light.  Neck: Normal range of motion. Neck supple.  No meningismus.  Cardiovascular: Normal rate, regular rhythm, normal heart sounds and intact distal pulses.   No murmur heard. Pulmonary/Chest: Effort normal. No respiratory distress. He has wheezes.  Increased work of breathing, very poor air exchange, tight wheezing throughout  Abdominal: Soft. There is no tenderness. There is no rebound and no guarding.  Musculoskeletal: Normal range of motion. He exhibits no edema or tenderness.  Neurological: He is alert and oriented to person, place, and time. No cranial nerve deficit. He exhibits normal muscle tone. Coordination normal.  No ataxia on finger to nose bilaterally. No pronator  drift. 5/5 strength throughout. CN 2-12 intact.Equal grip strength. Sensation intact.   Skin: Skin is warm.  Psychiatric: He has a normal mood and affect. His behavior is normal.  Nursing note and vitals reviewed.    ED Treatments / Results  Labs (all labs ordered are listed, but only abnormal results are displayed) Labs Reviewed  BASIC METABOLIC PANEL - Abnormal; Notable for the following:       Result Value   Glucose, Bld 287 (*)    All other components within normal limits  CBC  LIPASE, BLOOD  BRAIN NATRIURETIC PEPTIDE  D-DIMER, QUANTITATIVE (NOT AT [facility])  Rosezena Sensor, ED  Rosezena Sensor, ED    EKG  EKG Interpretation  Date/Time:  Saturday September 02 2016 01:11:15 EST Ventricular Rate:  99 PR Interval:    QRS Duration: 94 QT Interval:  328 QTC Calculation: 421 R Axis:   70 Text Interpretation:  Sinus rhythm Baseline wander in lead(s) V1 Artifact No significant change was found Confirmed by Manus GunningANCOUR  MD, Dmario Russom [phone]([id number]) on 09/02/2016 1:26:04 AM       Radiology Dg Chest 2 View  Result Date: 09/02/2016 CLINICAL DATA:  Epigastric and midsternal chest pain. Shortness of breath. Symptoms started earlier this evening. Productive cough for several months. History of asthma and hypertension. EXAM: CHEST  2 VIEW COMPARISON:  10/10/2015 FINDINGS: The heart size and mediastinal contours are within normal limits. Both lungs are clear. The visualized skeletal structures are unremarkable. IMPRESSION: No active cardiopulmonary disease. Electronically Signed   By: Burman NievesWilliam  Stevens M.D.   On: 09/02/2016 03:08    Procedures Procedures (including critical care time)  Medications Ordered in ED Medications  albuterol (PROVENTIL,VENTOLIN) solution continuous neb (not administered)  ipratropium (ATROVENT) nebulizer solution 0.5 mg (not administered)  methylPREDNISolone sodium succinate (SOLU-MEDROL) 125 mg/2 mL injection 125 mg (not administered)  aspirin chewable tablet 324 mg  (not administered)  ipratropium-albuterol (DUONEB) 0.5-2.5 (3) MG/3ML nebulizer solution 3 mL (3 mLs Nebulization Given 09/02/16 0123)     Initial Impression / Assessment and Plan / ED Course  I have reviewed the triage vital signs and the nursing notes.  Pertinent labs & imaging results that were available during my care of the patient were reviewed by me and considered in my medical decision making (see chart for details).  Clinical Course   Patient with intermittent right-sided chest pain which sounds atypical for ACS lasting just a few seconds at a time. No acute ischemic changes on EKG.  Patient wheezing and moving air very poorly. Will be given bronchodilators and Solu-Medrol.  Troponin and d-dimer negative. Hyperglycemia without DKA.  Patient much improved after continuous nebulizer, magnesium and steroids. He is moving air well. There is mild expiratory wheezing persisting.  Patient is adamant that he is to leave because his wife is in the car. States he cannot stay any longer. Recommended second troponin for his atypical chest pain. He is able to ambulate without desaturation feels much better. Advised to monitor sugars closely while on insulin.  Patient left the ED prior to receiving prescriptions or results of second troponin. He appeared to have capacity to make medical decisions and left AMA.  Final Clinical Impressions(s) / ED Diagnoses   Final diagnoses:  Atypical chest pain  COPD exacerbation [facility](HCC)    New Prescriptions New Prescriptions   No medications on file     Glynn OctaveStephen Eboney Claybrook, MD 09/02/16 0405

## 2017-03-24 ENCOUNTER — Emergency Department (HOSPITAL_COMMUNITY)
Admission: EM | Admit: 2017-03-24 | Discharge: 2017-03-24 | Disposition: A | Discharge status: Home / Self Care | Payer: Medicare Other | Attending: Emergency Medicine | Admitting: Emergency Medicine

## 2017-03-24 ENCOUNTER — Emergency Department (HOSPITAL_COMMUNITY): Payer: Medicare Other

## 2017-03-24 ENCOUNTER — Encounter (HOSPITAL_COMMUNITY): Payer: Self-pay | Admitting: Emergency Medicine

## 2017-03-24 DIAGNOSIS — Z79899 Other long term (current) drug therapy: Secondary | ICD-10-CM | POA: Insufficient documentation

## 2017-03-24 DIAGNOSIS — R079 Chest pain, unspecified: Secondary | ICD-10-CM

## 2017-03-24 DIAGNOSIS — J45909 Unspecified asthma, uncomplicated: Secondary | ICD-10-CM | POA: Insufficient documentation

## 2017-03-24 DIAGNOSIS — R0602 Shortness of breath: Secondary | ICD-10-CM | POA: Diagnosis not present

## 2017-03-24 DIAGNOSIS — R51 Headache: Secondary | ICD-10-CM | POA: Insufficient documentation

## 2017-03-24 DIAGNOSIS — E119 Type 2 diabetes mellitus without complications: Secondary | ICD-10-CM | POA: Insufficient documentation

## 2017-03-24 DIAGNOSIS — I10 Essential (primary) hypertension: Secondary | ICD-10-CM | POA: Insufficient documentation

## 2017-03-24 DIAGNOSIS — R0781 Pleurodynia: Secondary | ICD-10-CM | POA: Insufficient documentation

## 2017-03-24 DIAGNOSIS — Z7982 Long term (current) use of aspirin: Secondary | ICD-10-CM | POA: Insufficient documentation

## 2017-03-24 DIAGNOSIS — F17210 Nicotine dependence, cigarettes, uncomplicated: Secondary | ICD-10-CM | POA: Insufficient documentation

## 2017-03-24 DIAGNOSIS — Z794 Long term (current) use of insulin: Secondary | ICD-10-CM | POA: Insufficient documentation

## 2017-03-24 HISTORY — DX: Type 2 diabetes mellitus without complications: E11.9

## 2017-03-24 LAB — CBC
HCT: 45.2 % (ref 39.0–52.0)
Hemoglobin: 15.9 g/dL (ref 13.0–17.0)
MCH: 30 pg (ref 26.0–34.0)
MCHC: 35.2 g/dL (ref 30.0–36.0)
MCV: 85.3 fL (ref 78.0–100.0)
Platelets: 232 10*3/uL (ref 150–400)
RBC: 5.3 MIL/uL (ref 4.22–5.81)
RDW: 12.8 % (ref 11.5–15.5)
WBC: 8.1 10*3/uL (ref 4.0–10.5)

## 2017-03-24 LAB — I-STAT TROPONIN, ED: Troponin i, poc: 0 ng/mL (ref 0.00–0.08)

## 2017-03-24 LAB — BASIC METABOLIC PANEL
Anion gap: 12 (ref 5–15)
BUN: 11 mg/dL (ref 6–20)
CO2: 27 mmol/L (ref 22–32)
Calcium: 9.2 mg/dL (ref 8.9–10.3)
Chloride: 102 mmol/L (ref 101–111)
Creatinine, Ser: 0.88 mg/dL (ref 0.61–1.24)
GFR calc Af Amer: >60 mL/min (ref >60)
GFR calc non Af Amer: >60 mL/min (ref >60)
Glucose, Bld: 180 mg/dL — ABNORMAL HIGH (ref 65–99)
Potassium: 3.3 mmol/L — ABNORMAL LOW (ref 3.5–5.1)
Sodium: 141 mmol/L (ref 135–145)

## 2017-03-24 MED ORDER — PROMETHAZINE HCL 25 MG/ML IJ SOLN
12.5000 mg | Freq: Once | INTRAMUSCULAR | Status: AC
  Administered 2017-03-24: 12.5 mg via INTRAVENOUS
  Filled 2017-03-24: qty 1

## 2017-03-24 MED ORDER — NITROGLYCERIN 0.4 MG SL SUBL
0.4000 mg | SUBLINGUAL_TABLET | Freq: Once | SUBLINGUAL | Status: AC
  Administered 2017-03-24: 0.4 mg via SUBLINGUAL
  Filled 2017-03-24: qty 1

## 2017-03-24 MED ORDER — AMLODIPINE BESYLATE 5 MG PO TABS: 5.0000 mg | ORAL_TABLET | Freq: Every day | ORAL | 0 refills | Status: AC

## 2017-03-24 NOTE — ED Provider Notes (Signed)
[facility] Provider Note   CSN: [id number] Arrival date & time: 03/24/17  1225     History   Chief Complaint Chief Complaint  Patient presents with  . Chest Pain    HPI [name]. is a 51 y.o. male.  HPI Patient presents with chest pain. Comes and goes over the last couple days. It is sharp. Between the ribs on the left side. Last 2-3 seconds. Does have some shortness of breath does seem somewhat independent from the pain. No fevers. No cough. No diaphoresis. Checked his blood pressure today was elevated at around 180/120. States Dr. Mikeal Hawthorne has been adjusting his blood pressure medicines. States it has been more poorly controlled since the change. States he took 2 Xanax today because he was feeling anxious with the pain. Past Medical History:  Diagnosis Date  . Asthma   . Bipolar disorder, unspecified (HCC)   . Chest pain   . Diabetes mellitus without complication (HCC)   . Hypertension   . Seizure (HCC)   . Syncope     Patient Active Problem List   Diagnosis Date Noted  . Dyspnea 05/04/2011  . HTN (hypertension) 05/04/2011    Past Surgical History:  Procedure Laterality Date  . Left shoulder surgery         Home Medications    Prior to Admission medications   Medication Sig Start Date End Date Taking? Authorizing Provider  albuterol (PROVENTIL HFA;VENTOLIN HFA) 108 (90 Base) MCG/ACT inhaler Inhale 2 puffs into the lungs every 6 (six) hours as needed for wheezing or shortness of breath. 09/02/16  Yes Rancour, Jeannett Senior, MD  ALPRAZolam Prudy Feeler) 1 MG tablet Take 1 mg by mouth 4 (four) times daily as needed for anxiety.   Yes [provider]  aspirin 81 MG tablet Take 81 mg by mouth 2 (two) times daily.    Yes [provider]  fenofibrate (TRICOR) 48 MG tablet Take 48 mg by mouth at bedtime.   Yes [provider]  insulin aspart (NOVOLOG) 100 UNIT/ML injection Inject into the skin 3 (three) times daily before meals. Per  sliding scale   Yes [provider]  LANTUS SOLOSTAR 100 UNIT/ML Solostar Pen INJECT 16 UNITS UNDER THE SKIN AT BEDTIME 02/13/17  Yes [provider]  losartan-hydrochlorothiazide (HYZAAR) 100-25 MG tablet Take 1 tablet by mouth daily.   Yes [provider]  metFORMIN (GLUCOPHAGE) 1000 MG tablet Take 1,000 mg by mouth 2 (two) times daily. 03/12/17  Yes [provider]  Multiple Vitamin (MULTIVITAMIN WITH MINERALS) TABS tablet Take 1 tablet by mouth daily.   Yes [provider]  omeprazole (PRILOSEC) 40 MG capsule Take 1 capsule by mouth daily. 10/12/15  Yes [provider]  zolpidem (AMBIEN) 10 MG tablet Take 10 mg by mouth at bedtime as needed for sleep.   Yes [provider]  amLODipine (NORVASC) 5 MG tablet Take 1 tablet (5 mg total) by mouth daily. 03/24/17   Benjiman Core, MD    Family History Family History  Problem Relation Age of Onset  . Coronary artery disease Father   . Heart disease Father   . Coronary artery disease Other        Uncle  . Stroke Other        uncle    Social History Social History  Substance Use Topics  . Smoking status: Current Every Day Smoker    Packs/day: 0.50    Years: 30.00    Types: Cigarettes  .  Smokeless tobacco: Never Used  . Alcohol use No     Allergies   Lithium and Codeine   Review of Systems Review of Systems  Constitutional: Positive for fatigue. Negative for appetite change.  HENT: Negative for congestion.   Respiratory: Positive for shortness of breath.   Cardiovascular: Positive for chest pain. Negative for leg swelling.  Gastrointestinal: Negative for abdominal pain.  Genitourinary: Negative for flank pain.  Musculoskeletal: Negative for back pain.  Neurological: Positive for headaches.  Hematological: Negative for adenopathy.     Physical Exam Updated Vital Signs BP (!) 134/96   Pulse 74   Temp 97.6 F (36.4 C) (Oral)   Resp 20   Ht 6' (1.829 m)   Wt  112.9 kg (249 lb)   SpO2 97%   BMI 33.77 kg/m   Physical Exam  Constitutional: He appears well-developed.  HENT:  Head: Normocephalic.  Neck: Neck supple.  Cardiovascular: Normal rate.   Pulmonary/Chest: Effort normal.  Abdominal: Soft. There is no tenderness.  Musculoskeletal: He exhibits no edema.  Neurological: He is alert.  Skin: Skin is warm. Capillary refill takes less than 2 seconds.  Psychiatric: He has a normal mood and affect.     ED Treatments / Results  Labs (all labs ordered are listed, but only abnormal results are displayed) Labs Reviewed  BASIC METABOLIC PANEL - Abnormal; Notable for the following:       Result Value   Potassium 3.3 (*)    Glucose, Bld 180 (*)    All other components within normal limits  CBC  I-STAT TROPOININ, ED    EKG  EKG Interpretation  Date/Time:  Saturday March 24 2017 12:48:16 EDT Ventricular Rate:  87 PR Interval:    QRS Duration: 96 QT Interval:  355 QTC Calculation: 427 R Axis:   -11 Text Interpretation:  Sinus rhythm Confirmed by Rubin Payor  MD, Jezebelle Ledwell [phone]) on 03/24/2017 12:50:35 PM       Radiology Dg Chest 2 View  Result Date: 03/24/2017 CLINICAL DATA:  Chest pain. Smoker. Left-sided chest pain that radiates to the arm. EXAM: CHEST  2 VIEW COMPARISON:  09/02/2016. FINDINGS: Lungs are clear without airspace disease or pulmonary edema. Negative for a pneumothorax. Heart and mediastinum are within normal limits. Degenerative changes in lower thoracic spine. IMPRESSION: No active cardiopulmonary disease. Electronically Signed   By: Richarda Overlie M.D.   On: 03/24/2017 13:34    Procedures Procedures (including critical care time)  Medications Ordered in ED Medications  nitroGLYCERIN (NITROSTAT) SL tablet 0.4 mg (0.4 mg Sublingual Given 03/24/17 1300)  promethazine (PHENERGAN) injection 12.5 mg (12.5 mg Intravenous Given 03/24/17 1321)     Initial Impression / Assessment and Plan / ED Course  I have reviewed the triage  vital signs and the nursing notes.  Pertinent labs & imaging results that were available during my care of the patient were reviewed by me and considered in my medical decision making (see chart for details).     Patient with chest pain. Initially hypertensive. Has had recent changes in medicine. Blood pressures come down. Chest pain will only last a couple seconds at a time. Doubt cardiac cause. Discharge home. Added amlodipine to help control blood pressure better. Patient reportedly had a volume of his wife and I gave his discharge instructions to him while he was sleeping in the hallway.  Final Clinical Impressions(s) / ED Diagnoses   Final diagnoses:  Nonspecific chest pain  Hypertension, unspecified type    New Prescriptions Discharge  Medication List as of 03/24/2017  3:08 PM    START taking these medications   Details  amLODipine (NORVASC) 5 MG tablet Take 1 tablet (5 mg total) by mouth daily., Starting Sat 03/24/2017, Print         Benjiman CorePickering, Clemmie Marxen, MD 03/24/17 2034

## 2017-03-24 NOTE — ED Notes (Signed)
istat troponin 0.00

## 2017-03-24 NOTE — ED Triage Notes (Signed)
Pt reports HTN and has taken his meds, is diabetic   Dr August LuzGarber

## 2017-03-24 NOTE — ED Triage Notes (Signed)
Patient c/o left side chest pain that radiates into left arm with numbness and tingling. Denies any cardiac hx. Patient states shortness of breath, dizziness and weakness. Per patient blood pressure elevated today at 179/125 when checked.

## 2017-09-01 IMAGING — MR MR HEAD W/O CM
9 of 10 series · 35 of 48 positions shown · non-contrast
Comparison: CT HEAD July 06, 2014 at 5035 hours and MRI of the
head July 30, 2007

CLINICAL DATA: Slurred speech, facial droop, RIGHT-sided deficits,
last seen normal at 1311 hours. History of hypertension and
seizures.

EXAM:
MRI HEAD WITHOUT CONTRAST
TECHNIQUE: Multiplanar, multiecho pulse sequences of the brain and surrounding
structures were obtained without intravenous contrast.

[Series 3: DWI · axial · 3.0mm · 0.94mm/px · z∈[-12,+133]mm · 8 of 100 slices shown (1 of 2)]
[im 1/100]
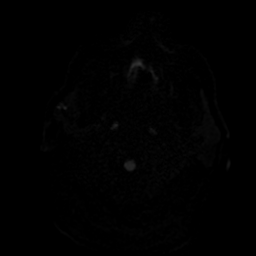
[im 12/100]
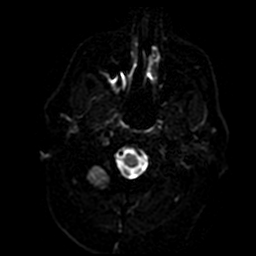
[im 34/100]
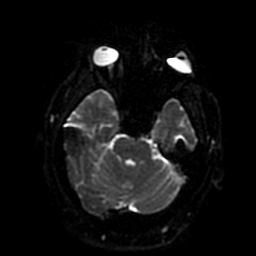
[im 45/100]
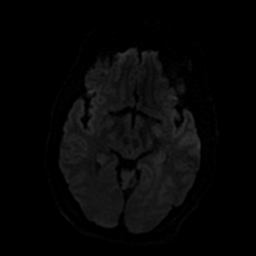
[im 56/100]
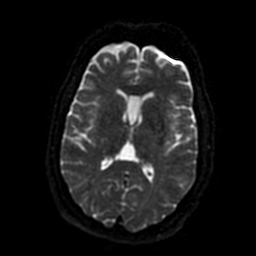
[im 67/100]
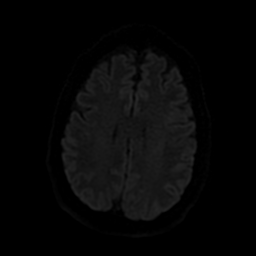
[im 89/100]
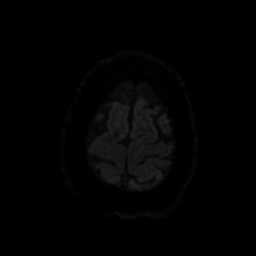
[im 100/100]
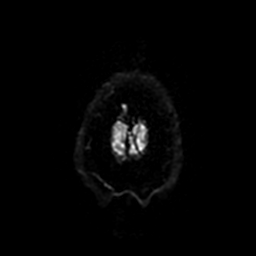

[Series 4: FLAIR · sagittal · 5.0mm · 0.47mm/px · 2 of 23 slices shown (1 of 2)]
[im 1/23]
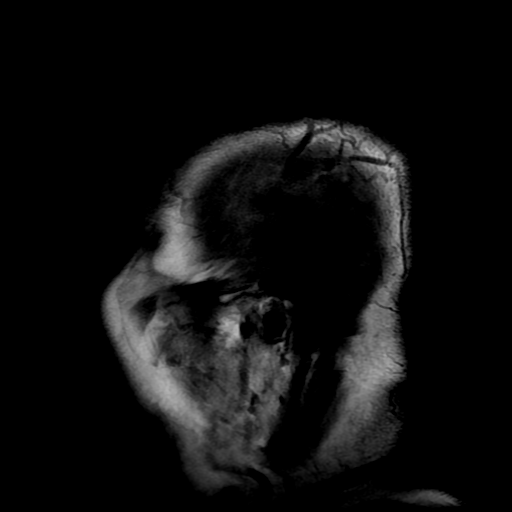
[im 23/23]
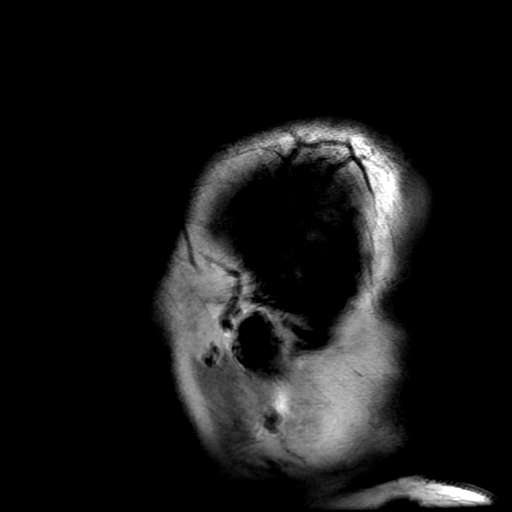

[Series 5: DWI · coronal · 4.0mm · 0.94mm/px · 7 of 72 slices shown (2 of 2)]
[im 1/72]
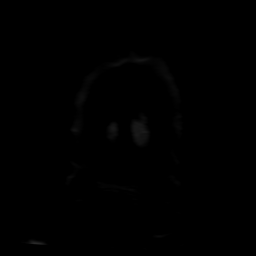
[im 12/72]
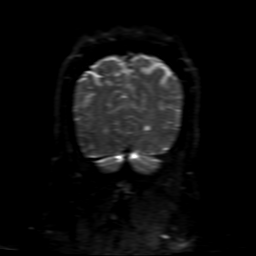
[im 24/72]
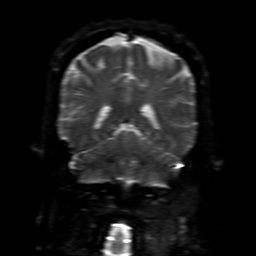
[im 36/72]
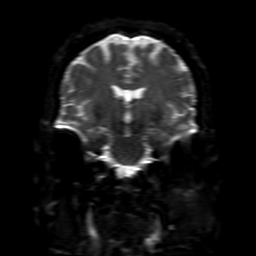
[im 48/72]
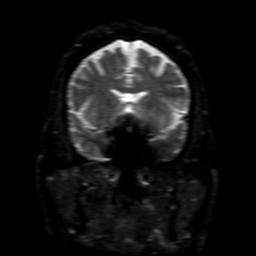
[im 60/72]
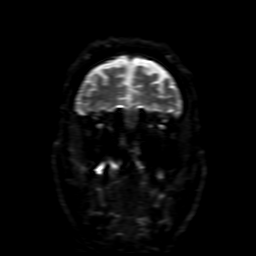
[im 72/72]
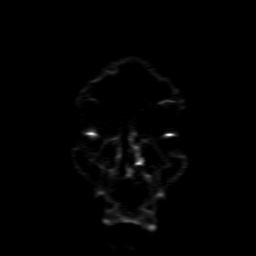

[Series 6: T2 · axial · 5.0mm · 0.47mm/px · z∈[-10,+131]mm · 2 of 25 slices shown]
[im 1/25]
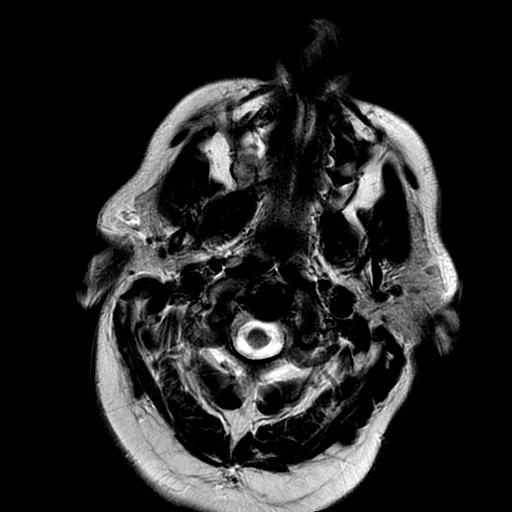
[im 25/25]
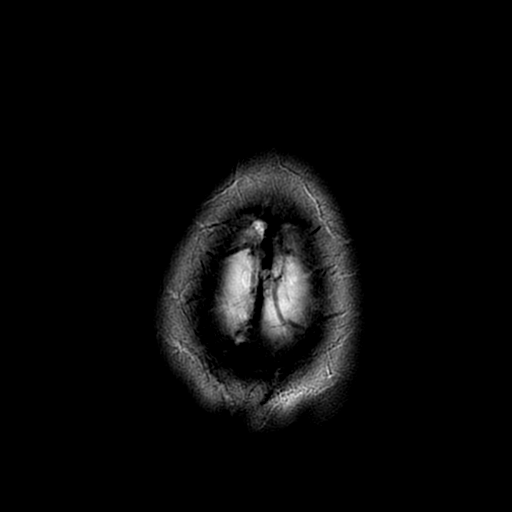

[Series 7: FLAIR · axial · 5.0mm · 0.47mm/px · z∈[-10,+131]mm · 2 of 25 slices shown (2 of 2)]
[im 1/25]
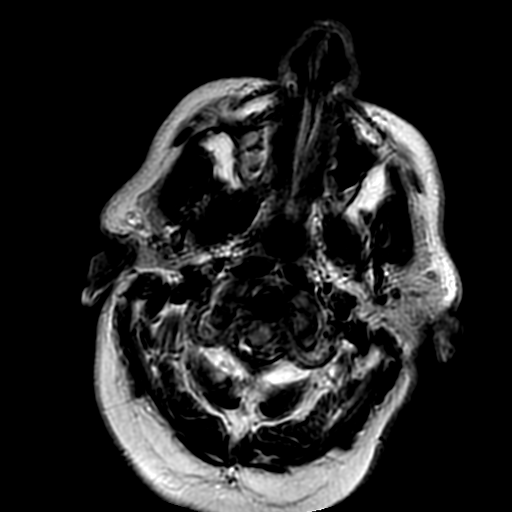
[im 25/25]
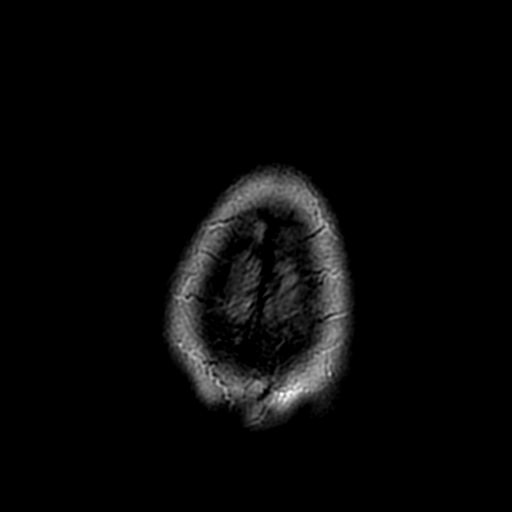

[Series 8: (person_name) · axial · 3.0mm · 0.47mm/px · z∈[-12,+24]mm · 3 of 100 slices shown]
[im 1/100]
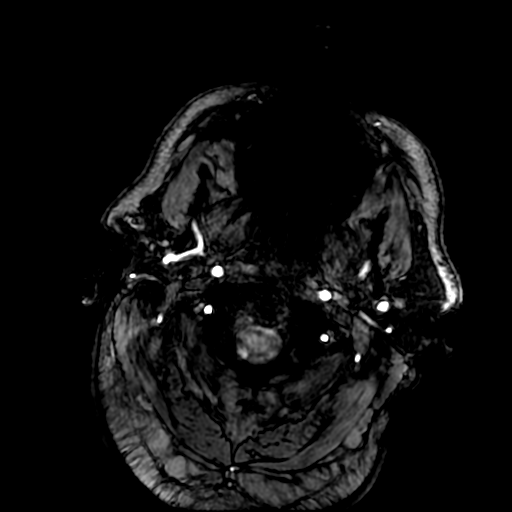
[im 13/100]
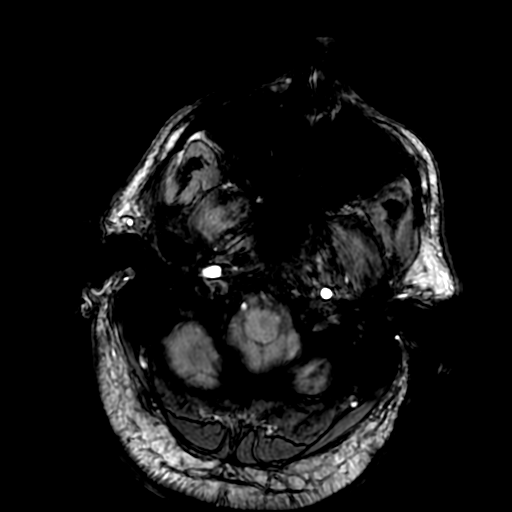
[im 25/100]
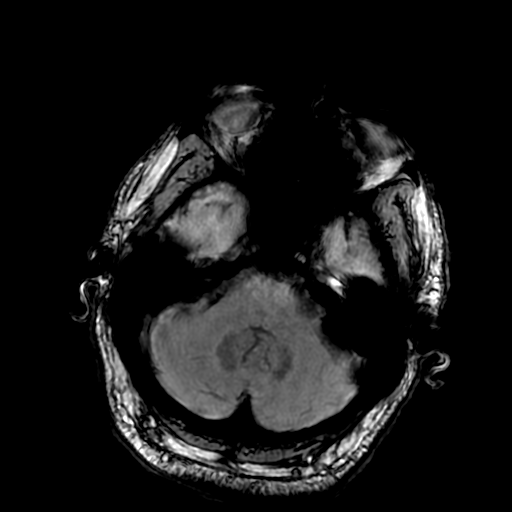

[Series 10: T2 post-contrast · coronal · 5.0mm · 0.39mm/px · 3 of 30 slices shown]
[im 1/30]
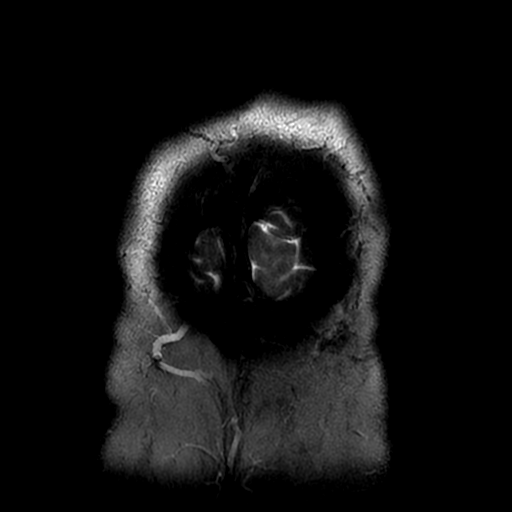
[im 15/30]
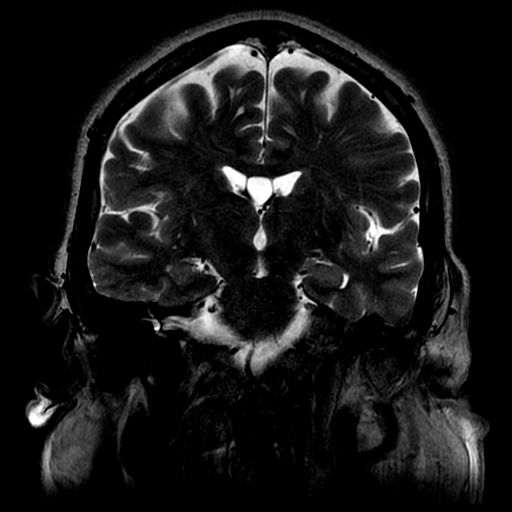
[im 30/30]
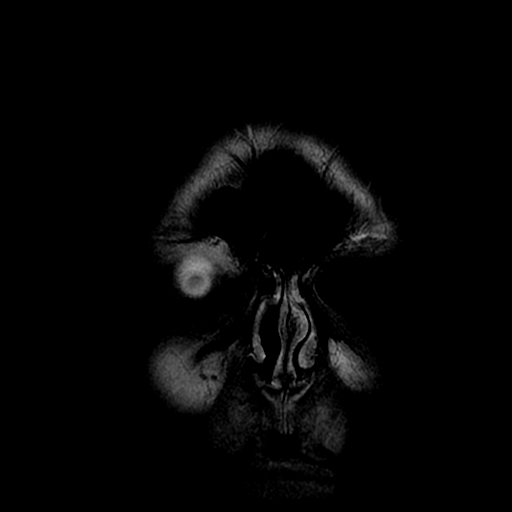

[Series 350: ADC · axial · 3.0mm · 0.94mm/px · z∈[-12,+133]mm · 5 of 50 slices shown (1 of 2)]
[im 1/50]
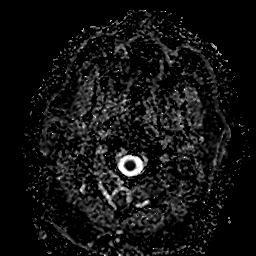
[im 13/50]
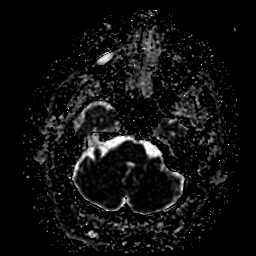
[im 25/50]
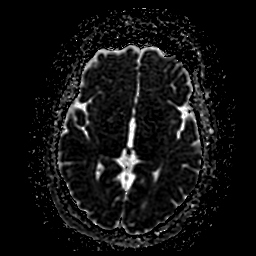
[im 37/50]
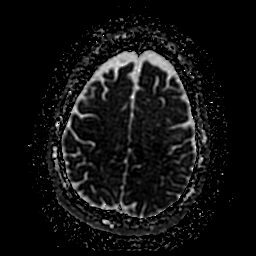
[im 50/50]
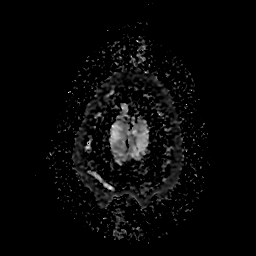

[Series 550: ADC · coronal · 4.0mm · 0.94mm/px · 3 of 36 slices shown (2 of 2)]
[im 1/36]
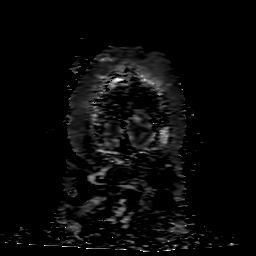
[im 18/36]
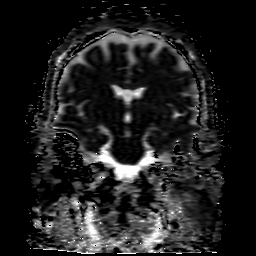
[im 36/36]
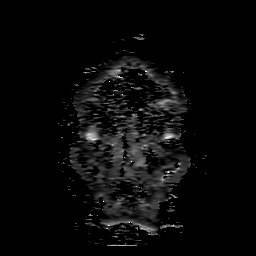

[35 of 48 positions shown; findings below may reference images not displayed]

FINDINGS: Multiple sequences are mildly motion degraded.

BRAIN: No reduced diffusion to suggest acute ischemia. No
susceptibility artifact to suggest hemorrhage. The ventricles and
sulci are normal for patient's age ; cavum septum pellucidum et
vergae is a normal variant. No suspicious parenchymal signal, masses
or mass effect. No abnormal extra-axial fluid collections. No
extra-axial masses though, contrast enhanced sequences would be more
sensitive.

VASCULAR: Normal major intracranial vascular flow voids present at
skull base.

SKULL AND UPPER CERVICAL SPINE: No abnormal sellar expansion. No
suspicious calvarial bone marrow signal. Craniocervical junction
maintained.

SINUSES/ORBITS: Mild maxillary sinus mucosal thickening. Mastoid air
cells are well aerated. The included ocular globes and orbital
contents are non-suspicious.

OTHER: None.
IMPRESSION: No acute intracranial process ; negative mildly motion degraded MRI
head.

## 2017-10-29 IMAGING — DX DG CHEST 2V
2 series · 2 of 2 positions shown · non-contrast
Comparison: 10/10/2015

CLINICAL DATA: Epigastric and midsternal chest pain. Shortness of
breath. Symptoms started earlier this evening. Productive cough for
several months. History of asthma and hypertension.

EXAM:
CHEST  2 VIEW

[chest pa]
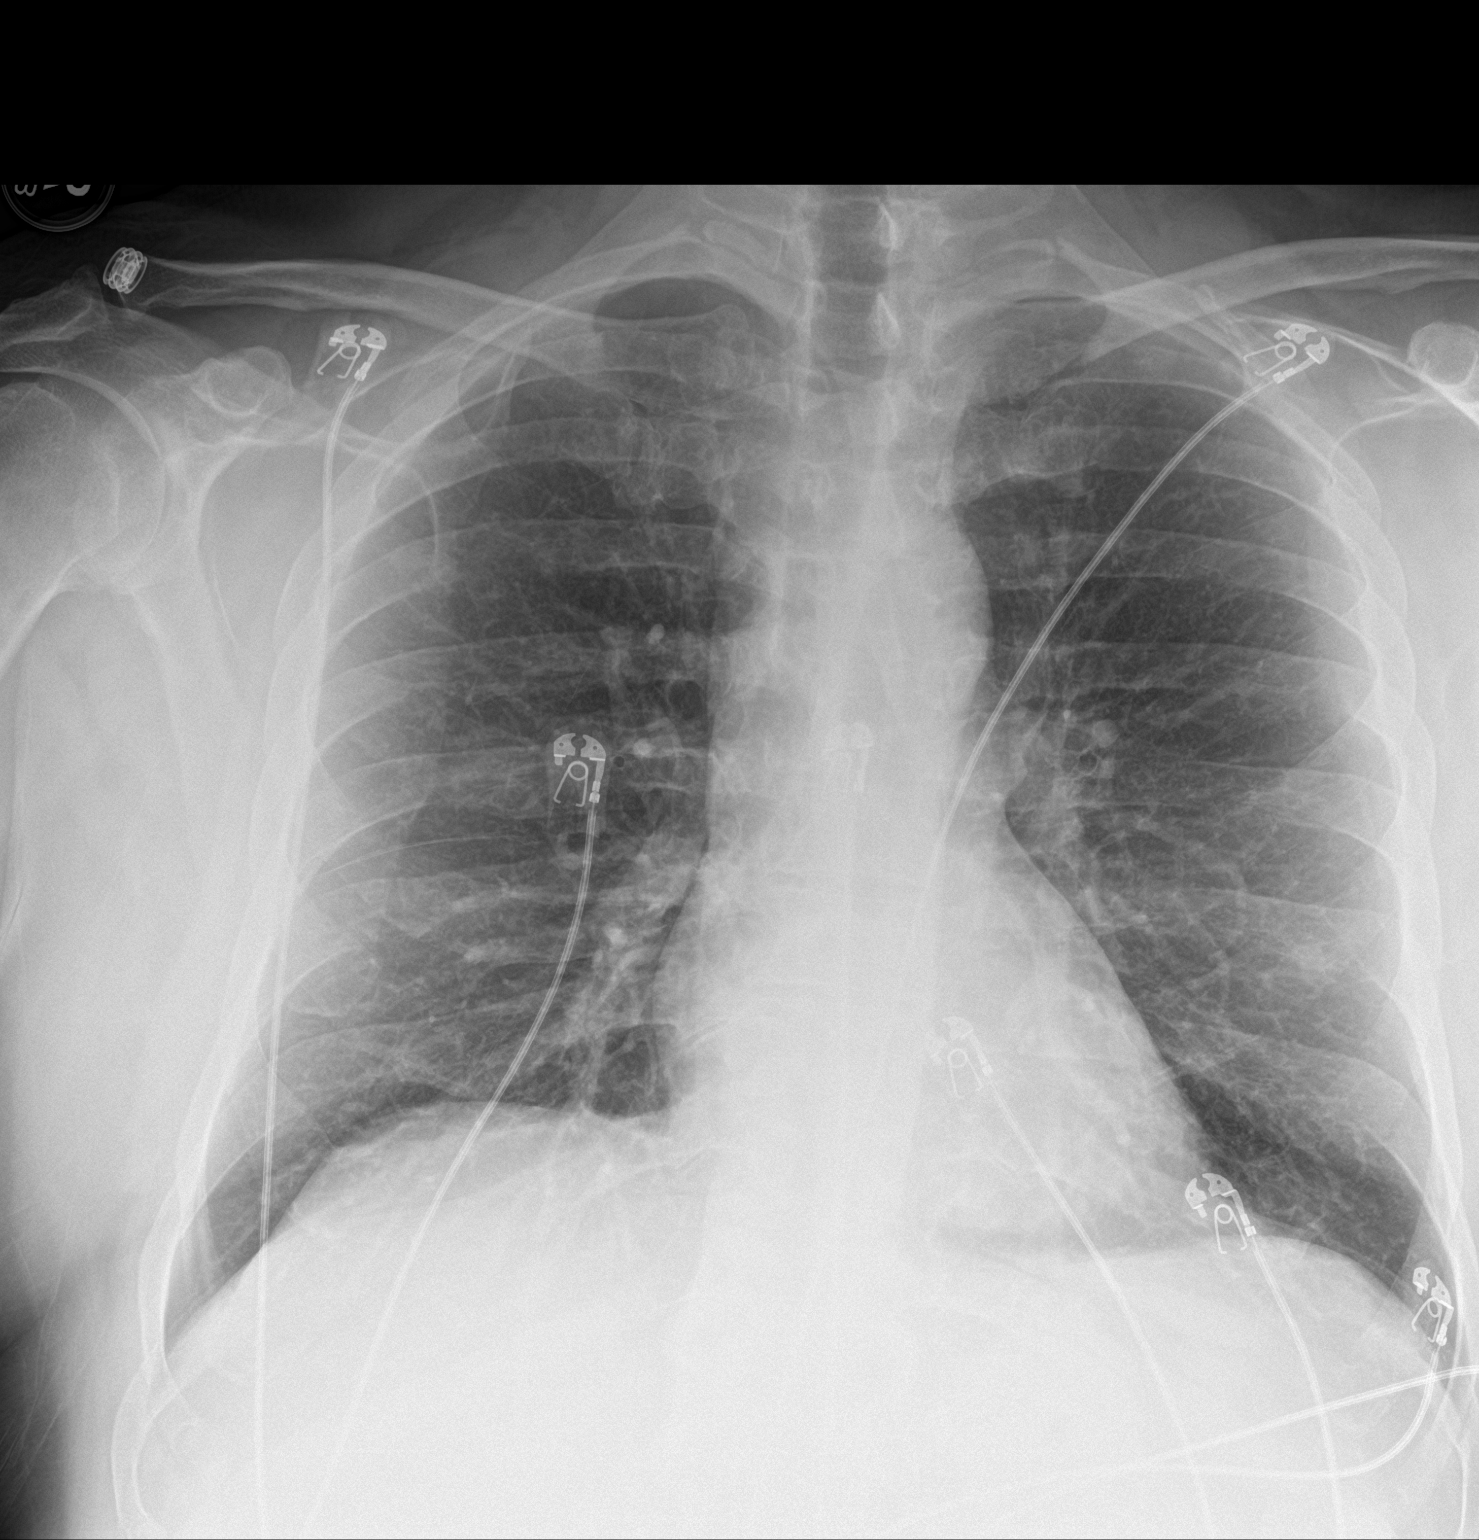

[chest lat]
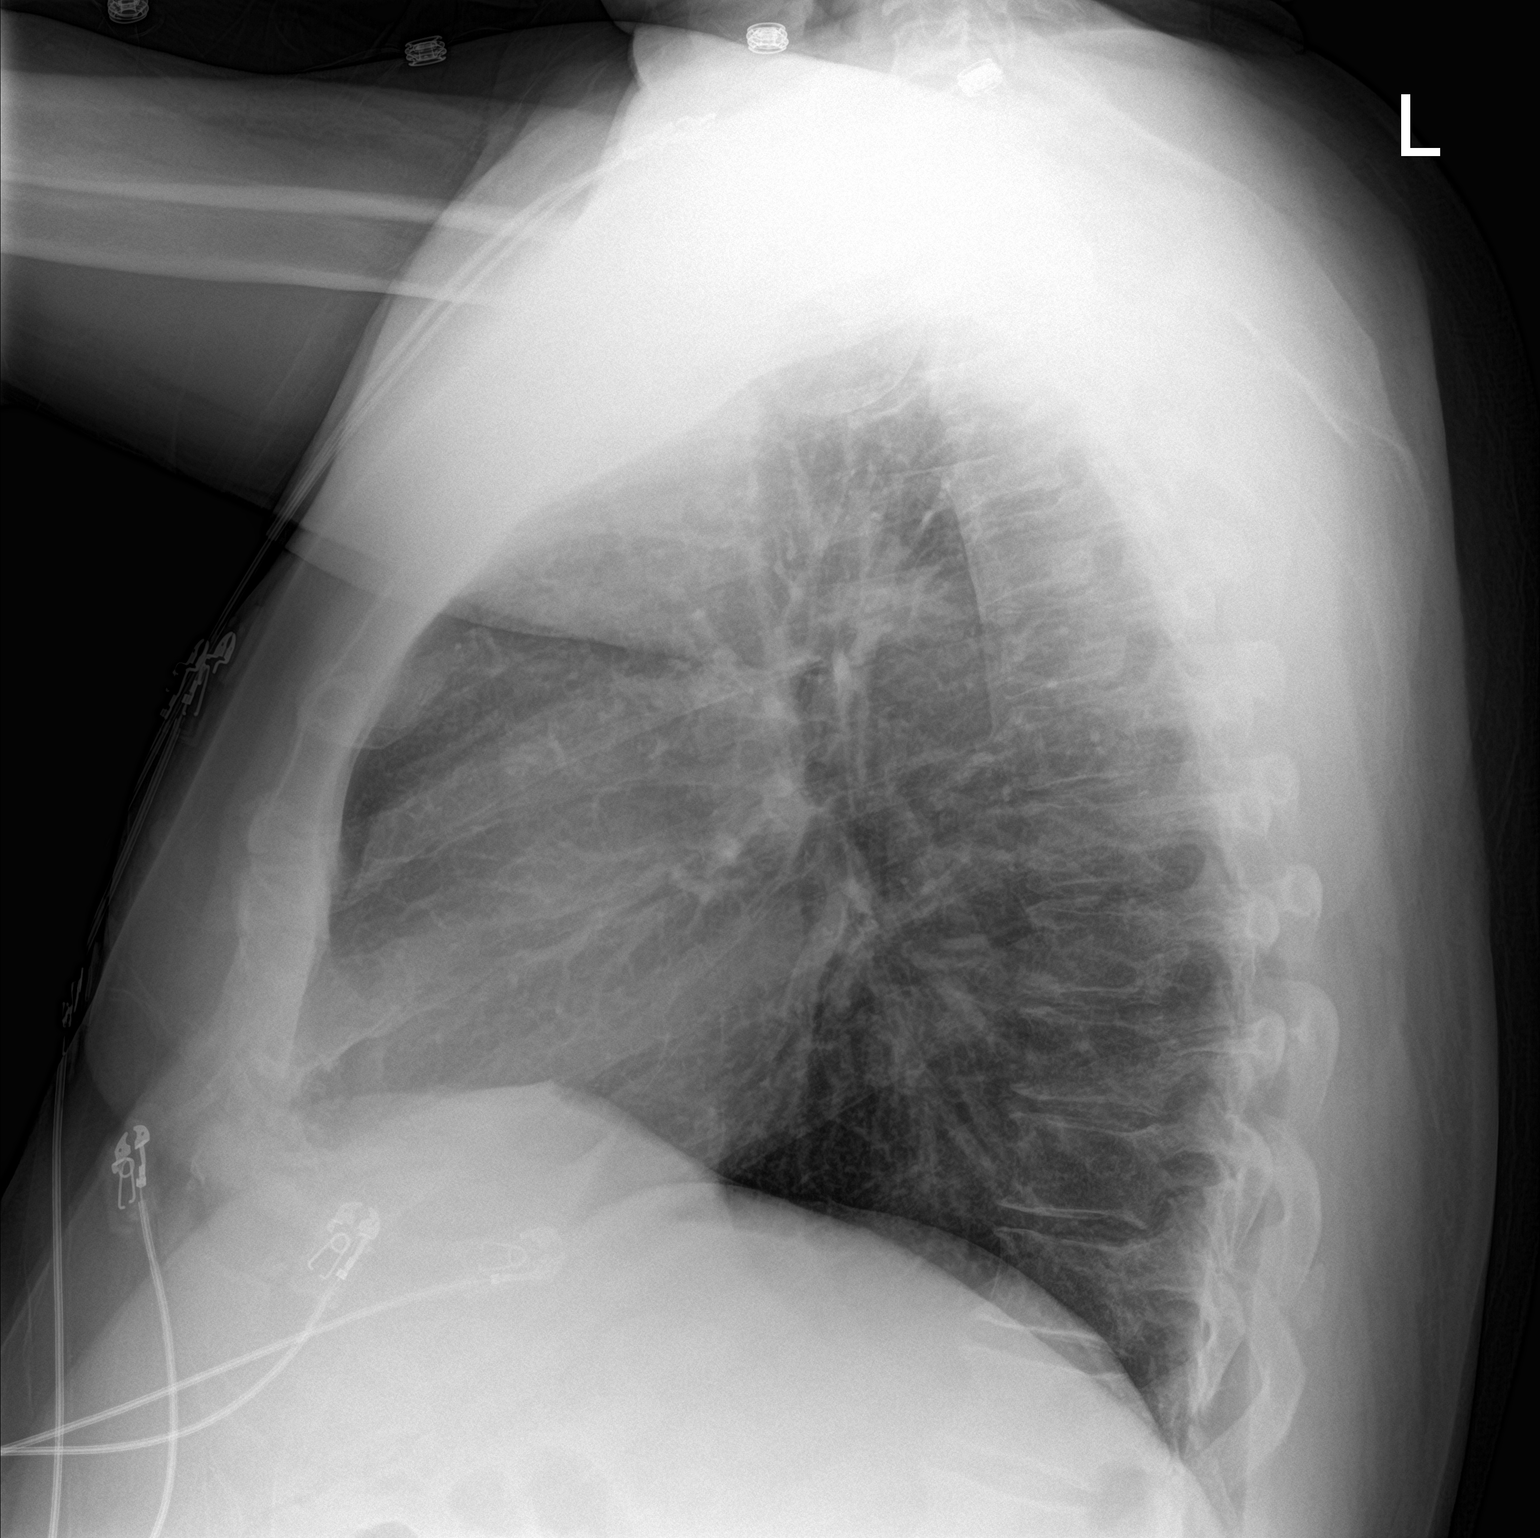

[2 of 2 positions shown; findings below may reference images not displayed]

FINDINGS: The heart size and mediastinal contours are within normal limits.
Both lungs are clear. The visualized skeletal structures are
unremarkable.
IMPRESSION: No active cardiopulmonary disease.

## 2017-11-30 ENCOUNTER — Other Ambulatory Visit: Payer: Self-pay

## 2017-11-30 ENCOUNTER — Emergency Department (HOSPITAL_COMMUNITY): Payer: Medicare Other

## 2017-11-30 ENCOUNTER — Encounter (HOSPITAL_COMMUNITY): Payer: Self-pay | Admitting: *Deleted

## 2017-11-30 ENCOUNTER — Emergency Department (HOSPITAL_COMMUNITY)
Admission: EM | Admit: 2017-11-30 | Discharge: 2017-11-30 | Disposition: A | Discharge status: Home / Self Care | Payer: Medicare Other | Attending: Emergency Medicine | Admitting: Emergency Medicine

## 2017-11-30 DIAGNOSIS — E119 Type 2 diabetes mellitus without complications: Secondary | ICD-10-CM | POA: Diagnosis not present

## 2017-11-30 DIAGNOSIS — Z79899 Other long term (current) drug therapy: Secondary | ICD-10-CM | POA: Insufficient documentation

## 2017-11-30 DIAGNOSIS — G8929 Other chronic pain: Secondary | ICD-10-CM | POA: Diagnosis not present

## 2017-11-30 DIAGNOSIS — Z794 Long term (current) use of insulin: Secondary | ICD-10-CM | POA: Insufficient documentation

## 2017-11-30 DIAGNOSIS — M545 Low back pain, unspecified: Secondary | ICD-10-CM

## 2017-11-30 DIAGNOSIS — J45909 Unspecified asthma, uncomplicated: Secondary | ICD-10-CM | POA: Insufficient documentation

## 2017-11-30 DIAGNOSIS — Z7982 Long term (current) use of aspirin: Secondary | ICD-10-CM | POA: Diagnosis not present

## 2017-11-30 DIAGNOSIS — I10 Essential (primary) hypertension: Secondary | ICD-10-CM | POA: Insufficient documentation

## 2017-11-30 DIAGNOSIS — Z87891 Personal history of nicotine dependence: Secondary | ICD-10-CM | POA: Diagnosis not present

## 2017-11-30 MED ORDER — DIAZEPAM 5 MG PO TABS
5.0000 mg | ORAL_TABLET | Freq: Once | ORAL | Status: AC
  Administered 2017-11-30: 5 mg via ORAL
  Filled 2017-11-30: qty 1

## 2017-11-30 MED ORDER — METHOCARBAMOL 500 MG PO TABS: ORAL_TABLET | ORAL | 0 refills | Status: AC

## 2017-11-30 MED ORDER — KETOROLAC TROMETHAMINE 30 MG/ML IJ SOLN
30.0000 mg | Freq: Once | INTRAMUSCULAR | Status: AC
  Administered 2017-11-30: 30 mg via INTRAMUSCULAR
  Filled 2017-11-30: qty 1

## 2017-11-30 MED ORDER — NAPROXEN 250 MG PO TABS: ORAL_TABLET | ORAL | 0 refills | Status: AC

## 2017-11-30 NOTE — Discharge Instructions (Signed)
Use ice and heat on your back for comfort. Take the medications as prescribed. You should have medications to take at home. Follow up with Dr Mikeal HawthorneGarba if you aren't improving.

## 2017-11-30 NOTE — ED Notes (Signed)
Pt ambulated to nurses station requesting a sprite. Pt ambulated back to room with no difficulties.

## 2017-11-30 NOTE — ED Provider Notes (Signed)
[facility] EMERGENCY DEPARTMENT Provider Note   CSN: [id number] Arrival date & time: 11/30/17  [id number]  Time seen 04:00 AM    History   Chief Complaint Chief Complaint  Patient presents with  . Back Pain    HPI Carlos CarbonJimmy L Jr Jr. is a 52 y.o. male.  HPI when I asked the patient what is going on tonight he starts out with "I fell off a two-story house in 1996".  And then he goes on to list all of his injuries.  He states he is refusing surgery.  He finally gets around to what happened tonight.  He states at 9 PM he felt cold air coming around the window where they have a window unit in place.  He states he had his wife helped him to start to get the unit out and once it was halfway out he told her to let go.  He states he then was holding the air conditioning unit by himself and he had to drop it because he got acute onset of worsening of his usual chronic low back pain.  He states the pain radiates down into his right foot which he has also had before.  He describes it as burning and aching.  Patient states he has been told he has sciatica.  He denies hearing a pop or crack.  When I asked what part of his back is hurting he states "L1 and L2".  He denies any incontinence either urinary or rectal.  PCP Rometta EmeryGarba, Mohammad L, MD   Past Medical History:  Diagnosis Date  . Asthma   . Bipolar disorder, unspecified (HCC)   . Chest pain   . Diabetes mellitus without complication (HCC)   . Hypertension   . Seizure (HCC)   . Syncope     Patient Active Problem List   Diagnosis Date Noted  . Dyspnea 05/04/2011  . HTN (hypertension) 05/04/2011    Past Surgical History:  Procedure Laterality Date  . Left shoulder surgery         Home Medications    Prior to Admission medications   Medication Sig Start Date End Date Taking? Authorizing Provider  albuterol (PROVENTIL HFA;VENTOLIN HFA) 108 (90 Base) MCG/ACT inhaler Inhale 2 puffs into the lungs every 6 (six) hours as needed for  wheezing or shortness of breath. 09/02/16   Rancour, Jeannett SeniorStephen, MD  ALPRAZolam Prudy Feeler(XANAX) 1 MG tablet Take 1 mg by mouth 4 (four) times daily as needed for anxiety.    [provider]  amLODipine (NORVASC) 5 MG tablet Take 1 tablet (5 mg total) by mouth daily. 03/24/17   Benjiman CorePickering, Nathan, MD  aspirin 81 MG tablet Take 81 mg by mouth 2 (two) times daily.     [provider]  fenofibrate (TRICOR) 48 MG tablet Take 48 mg by mouth at bedtime.    [provider]  insulin aspart (NOVOLOG) 100 UNIT/ML injection Inject into the skin 3 (three) times daily before meals. Per sliding scale    [provider]  LANTUS SOLOSTAR 100 UNIT/ML Solostar Pen INJECT 16 UNITS UNDER THE SKIN AT BEDTIME 02/13/17   [provider]  losartan-hydrochlorothiazide (HYZAAR) 100-25 MG tablet Take 1 tablet by mouth daily.    [provider]  metFORMIN (GLUCOPHAGE) 1000 MG tablet Take 1,000 mg by mouth 2 (two) times daily. 03/12/17   [provider]  methocarbamol (ROBAXIN) 500 MG tablet Take 1 or 2 po Q 6hrs for pain 11/30/17   Devoria AlbeKnapp, Emanuel Campos, MD  Multiple Vitamin (MULTIVITAMIN WITH MINERALS) TABS tablet Take 1 tablet by mouth daily.    [provider]  naproxen (NAPROSYN) 250 MG tablet Take 1 po BID with food prn pain 11/30/17   Devoria Albe, MD  omeprazole (PRILOSEC) 40 MG capsule Take 1 capsule by mouth daily. 10/12/15   [provider]  zolpidem (AMBIEN) 10 MG tablet Take 10 mg by mouth at bedtime as needed for sleep.    [provider]    Family History Family History  Problem Relation Age of Onset  . Coronary artery disease Father   . Heart disease Father   . Coronary artery disease Other        Uncle  . Stroke Other        uncle    Social History Social History   Tobacco Use  . Smoking status: Former Smoker    Packs/day: 0.50    Years: 30.00    Pack years: 15.00    Types: Cigarettes  . Smokeless tobacco: Never Used  Substance Use  Topics  . Alcohol use: No  . Drug use: No    Comment: Former drug user  lives at home Lives with spouse   Allergies   Lithium and Codeine   Review of Systems Review of Systems  All other systems reviewed and are negative.    Physical Exam Updated Vital Signs BP (!) 150/92 (BP Location: Left Arm)   Pulse 85   Temp 98.3 F (36.8 C) (Oral)   Resp 18   Ht 5\' 11"  (1.803 m)   Wt 112.5 kg (248 lb)   SpO2 98%   BMI 34.59 kg/m   Physical Exam  Constitutional: He is oriented to person, place, and time. He appears well-developed and well-nourished.  When I enter the room the patient is laying on his back with his right leg up in the air with his foot on the picture frame that is in the room to write down providers names that are working.  HENT:  Head: Normocephalic and atraumatic.  Right Ear: External ear normal.  Left Ear: External ear normal.  Nose: Nose normal.  Patient is edentulous so sometimes his speech is hard to understand  Eyes: Conjunctivae and EOM are normal. Pupils are equal, round, and reactive to light.  Neck: Normal range of motion.  Cardiovascular: Normal rate, regular rhythm and normal heart sounds.  No murmur heard. Pulmonary/Chest: Effort normal and breath sounds normal. No stridor. No respiratory distress. He has no wheezes. He has no rales.  Musculoskeletal: He exhibits tenderness.  Patellar reflexes on the left are 2+, on the right are 1+.  He has positive straight leg raising on the right.  When I examine his back he is tender in the lower sacral area in the midline, he does not have tenderness over the paraspinous muscles.  When he does lumbar range of motion he has some discomfort in the same area in all directions but seems worse to the right.  Neurological: He is alert and oriented to person, place, and time. No cranial nerve deficit.  Skin: Skin is warm and dry. Rash noted.  Psychiatric: He has a normal mood and affect. His behavior is normal.  Thought content normal.  Nursing note and vitals reviewed.    ED Treatments / Results  Labs (all labs ordered are listed, but only abnormal results are displayed) Labs Reviewed - No data to display  EKG  EKG Interpretation None       Radiology  No results found.  Procedures Procedures (including critical care time)  Medications Ordered in ED Medications  ketorolac (TORADOL) 30 MG/ML injection 30 mg (30 mg Intramuscular Given 11/30/17 0422)  diazepam (VALIUM) tablet 5 mg (5 mg Oral Given 11/30/17 0422)     Initial Impression / Assessment and Plan / ED Course  I have reviewed the triage vital signs and the nursing notes.  Pertinent labs & imaging results that were available during my care of the patient were reviewed by me and considered in my medical decision making (see chart for details).     Patient told me he was trying to hold out until he could go to [facility] in the morning because "that is were all my specialists are".  However when I look in care everywhere he has an appointment on February 19 with cardiology as a new patient, otherwise there is no information in the records on this patient.  It appears he must have missed that appointment because there is no documentation of that visit.  Patient CBG done by EMS was 123.  Patient's last blood work was June 2018 with a BUN of 11 and creatinine 0.88.  I ordered Toradol IM for the patient and oral Valium.  He states his wife is going to come pick him up.  Patient wanted L lumbar spine x-ray to be done.  That was ordered.  Patient is upset because he is not getting a pain pill.  However when I look at the database the patient is high risk for abusing pain medications.  He then told the nurse he wanted to be discharged.  He told the nurse he was going to go to [facility] this morning.  Review of the [place] shows patient has a overdose risk score of 790.  He has had 124 controlled prescriptions written  in the past 2 years by 18 providers and 4 pharmacies.  He had #42 Suboxone prescribed on February 12 by Willeen Cass.  He had #60 Soma 350 mg tablets prescribed on February 5 by Earlie Lou, #90 alprazolam 2 mg tablets on December 31, #60 Adderall 30 mg tablets on December 31 and #30 Ambien 10 mg on December 31 by Dolores Frame.  These are all ongoing for the past 2 years.  Final Clinical Impressions(s) / ED Diagnoses   Final diagnoses:  Acute exacerbation of chronic low back pain    ED Discharge Orders        Ordered    naproxen (NAPROSYN) 250 MG tablet     11/30/17 0521    methocarbamol (ROBAXIN) 500 MG tablet     11/30/17 0521      Plan discharge  Devoria Albe, MD, Concha Pyo, MD 11/30/17 [phone]

## 2017-11-30 NOTE — ED Triage Notes (Signed)
Pt brought in by [facility] for c/o back pain; pt lifted something heavy and has had back pain since

## 2017-11-30 NOTE — ED Notes (Signed)
Pt refused xray 

## 2018-05-24 ENCOUNTER — Ambulatory Visit: Payer: Medicare Other | Admitting: Cardiology

## 2023-12-13 DIAGNOSIS — E119 Type 2 diabetes mellitus without complications: Secondary | ICD-10-CM | POA: Diagnosis not present

## 2023-12-13 DIAGNOSIS — E785 Hyperlipidemia, unspecified: Secondary | ICD-10-CM | POA: Diagnosis not present

## 2023-12-13 DIAGNOSIS — F411 Generalized anxiety disorder: Secondary | ICD-10-CM | POA: Diagnosis not present

## 2023-12-13 DIAGNOSIS — F902 Attention-deficit hyperactivity disorder, combined type: Secondary | ICD-10-CM | POA: Diagnosis not present

## 2023-12-13 DIAGNOSIS — R002 Palpitations: Secondary | ICD-10-CM | POA: Diagnosis not present

## 2023-12-13 DIAGNOSIS — A063 Ameboma of intestine: Secondary | ICD-10-CM | POA: Diagnosis not present

## 2023-12-13 DIAGNOSIS — Z125 Encounter for screening for malignant neoplasm of prostate: Secondary | ICD-10-CM | POA: Diagnosis not present

## 2023-12-13 DIAGNOSIS — I10 Essential (primary) hypertension: Secondary | ICD-10-CM | POA: Diagnosis not present

## 2023-12-13 DIAGNOSIS — A062 Amebic nondysenteric colitis: Secondary | ICD-10-CM | POA: Diagnosis not present

## 2024-01-03 DIAGNOSIS — H5203 Hypermetropia, bilateral: Secondary | ICD-10-CM | POA: Diagnosis not present

## 2024-07-11 ENCOUNTER — Telehealth: Payer: Self-pay

## 2024-07-11 NOTE — Telephone Encounter (Signed)
 Patient called, recording this call could not be completed as dialed. [facility] called, recording to leave a message. No message left. Per the recorded call from specialist, the [facility] called [facility]. This is not an established patient at this office.    Copied from CRM #[id number]. Topic: Clinical - Prescription Issue >> Jul 11, 2024  9:37 AM [place] E wrote: Reason for CRM: [facility] called in to request a refill for metFORMIN (GLUCOPHAGE) 1000 MG tablet, however agent could not locate who recently prescribed this medication, where the representative from [facility] called into a different location as well.

## 2024-11-12 NOTE — Progress Notes (Signed)
 Rutha LITTIE Rodgers Mickey.                                          MRN: [id number]   11/12/2024   The [facility] Quality Team Specialist reviewed this patient medical record for the purposes of chart review for care gap closure. The following were reviewed: chart review for care gap closure-kidney health evaluation for diabetes:eGFR  and uACR.    [facility]I Quality Team
